# Patient Record
Sex: Female | Born: 1975 | Race: White | Hispanic: No | Marital: Single | Smoking: Never smoker
Health system: Southern US, Community
[De-identification: ages and names within clinical notes are randomized; demographics above are authoritative.]

## PROBLEM LIST (undated history)

## (undated) DIAGNOSIS — E785 Hyperlipidemia, unspecified: Secondary | ICD-10-CM

## (undated) DIAGNOSIS — K5090 Crohn's disease, unspecified, without complications: Secondary | ICD-10-CM

## (undated) DIAGNOSIS — E039 Hypothyroidism, unspecified: Secondary | ICD-10-CM

## (undated) DIAGNOSIS — M8580 Other specified disorders of bone density and structure, unspecified site: Secondary | ICD-10-CM

## (undated) DIAGNOSIS — K259 Gastric ulcer, unspecified as acute or chronic, without hemorrhage or perforation: Secondary | ICD-10-CM

## (undated) DIAGNOSIS — N809 Endometriosis, unspecified: Secondary | ICD-10-CM

## (undated) DIAGNOSIS — I82409 Acute embolism and thrombosis of unspecified deep veins of unspecified lower extremity: Secondary | ICD-10-CM

## (undated) DIAGNOSIS — R51 Headache: Secondary | ICD-10-CM

## (undated) DIAGNOSIS — M810 Age-related osteoporosis without current pathological fracture: Secondary | ICD-10-CM

## (undated) DIAGNOSIS — R519 Headache, unspecified: Secondary | ICD-10-CM

## (undated) HISTORY — PX: BREAST CYST EXCISION: SHX579

## (undated) HISTORY — DX: Endometriosis, unspecified: N80.9

## (undated) HISTORY — DX: Hypothyroidism, unspecified: E03.9

## (undated) HISTORY — PX: APPENDECTOMY: SHX54

## (undated) HISTORY — PX: PELVIC LAPAROSCOPY: SHX162

## (undated) HISTORY — PX: LAPAROSCOPY: SHX197

## (undated) HISTORY — PX: THYROIDECTOMY: SHX17

## (undated) HISTORY — PX: OOPHORECTOMY: SHX86

## (undated) HISTORY — DX: Other specified disorders of bone density and structure, unspecified site: M85.80

---

## 2004-04-05 ENCOUNTER — Ambulatory Visit: Payer: Self-pay | Admitting: Internal Medicine

## 2005-09-20 ENCOUNTER — Ambulatory Visit: Payer: Self-pay | Admitting: Family Medicine

## 2005-12-26 ENCOUNTER — Ambulatory Visit: Payer: Self-pay | Admitting: Gynecology

## 2006-01-09 ENCOUNTER — Ambulatory Visit: Payer: Self-pay | Admitting: Internal Medicine

## 2006-05-31 ENCOUNTER — Ambulatory Visit: Payer: Self-pay | Admitting: Gynecology

## 2006-06-25 ENCOUNTER — Ambulatory Visit: Payer: Self-pay | Admitting: Gynecology

## 2007-02-18 ENCOUNTER — Ambulatory Visit: Payer: Self-pay | Admitting: Family Medicine

## 2007-06-24 ENCOUNTER — Ambulatory Visit: Payer: Self-pay | Admitting: Family Medicine

## 2008-10-14 ENCOUNTER — Ambulatory Visit: Payer: Self-pay | Admitting: Internal Medicine

## 2009-03-04 ENCOUNTER — Ambulatory Visit: Payer: Self-pay | Admitting: Unknown Physician Specialty

## 2010-05-28 ENCOUNTER — Other Ambulatory Visit: Payer: Self-pay | Admitting: Family Medicine

## 2010-05-29 NOTE — Telephone Encounter (Signed)
Refill request

## 2010-05-31 NOTE — Telephone Encounter (Signed)
This pt. Is a [facility] pt. Of mine---Can we forward this info to them.

## 2010-08-08 NOTE — Assessment & Plan Note (Signed)
NAME:  Kelly Colon, Kelly Colon                  ACCOUNT NO.:  [id number]   MEDICAL RECORD NO.:  [id number]          PATIENT TYPE:  POB   LOCATION:  [facility] at [facility]         FACILITY:  [facility]   PHYSICIAN:  Tinnie Gens, MD        DATE OF BIRTH:  12/31/1975   DATE OF SERVICE:  06/24/2007                                  CLINIC NOTE   CHIEF COMPLAINT:  IUD insertion.   HISTORY OF PRESENT ILLNESS:  The patient is a 35 year old nullipara who  has significant endometriosis.  She is status post LSO.  She has been on  Depo Lupron for the better part of 9 years and was trying to come off of  that.  She has not undergone a DEXA scan. She does take calcium daily  basis.  She also has hypothyroidism and is on Synthroid 75 mcg daily for  quite some time.  The patient was instructed by Dr. Mia Creek to come off  for Depo Lupron and have an IUD inserted. Her insurance runs out today  and she would like to have it done today.  Her last Depo Lupron was in  December 2008. She has not had a cycle back since then.  She has been  tried on oral contraceptives continuously in the past and they have not  really worked for her.  A lengthy discussion was had about attempting to  have her on this medication in addition to IUD once her Depo Lupron  wears off.  The patient was counseled about the risk of this procedure  including risk of perforation of her uterus, given her postmenopausal  status and that she has never had a child before and difficulty in  trying to insert an IUD.   PHYSICAL EXAMINATION:  As noted in the chart, she is a well-developed,  well-nourished female in no acute distress.  GU:  She has very atrophic vaginal atrophy happening and her cervix is  very small and atrophic.   PROCEDURE:  Cervix is cleaned with Betadine x2 and is grasped with  single tooth tenaculum anteriorly. An off sounder  was used to try open  that as in the cervix and the uterus sounded approximately 6 cm, but was  not clear that  I was ever really past the cervix.  Attempt was made 3 or  4 times to place IUD, but it could not be inserted safely. The patient  also very tense during the exam and does report that she is not sexually  active.  After abandoning the procedure, the patient became quite  tearful, upset and asked to speak to [name].   IMPRESSION:  Endometriosis, long-term use of Depo Lupron, failed IUD  insertion today.   PLAN:  A very lengthy discussion was held with the patient regarding her  long-term use of Depo Lupron, its implications and what that means  exactly.  I am concerned about her bone health and did suggest a DEXA  scan, although she seems reluctant to get this done.  Also, discussed  further management now that she has failed IUD insertion. Continued OCs  are an option.  I do feel like she should  probably see REI for  discussion regarding her fertility.  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  The patient is not  in a relationship at the present time and was not sexually active, but  egg retrieval, hysterectomy, salpingo-oophorectomy were all discussed  with this patient as possible treatment options as well as IVF and what  else may have to be done for her.  She seemed very reluctant to hear  this and not sure that she will not have to go back on Depo Lupron for  her symptom management, but I do feel  like without any measurable data about her bone health that I would hold  off on reviewing this at present time.  She will go on Fem Con FE for  now continuously and will refer her for a DEXA scan and REI.           ______________________________  Tinnie Gens, MD     TP/MEDQ  D:  06/24/2007  T:  06/24/2007  Job:  [id number]

## 2010-10-03 ENCOUNTER — Other Ambulatory Visit: Payer: Self-pay | Admitting: *Deleted

## 2010-10-11 ENCOUNTER — Telehealth: Payer: Self-pay | Admitting: *Deleted

## 2010-10-11 DIAGNOSIS — M8580 Other specified disorders of bone density and structure, unspecified site: Secondary | ICD-10-CM

## 2010-10-11 MED ORDER — ALENDRONATE SODIUM 70 MG PO TABS: 70.0000 mg | ORAL_TABLET | ORAL | Status: DC

## 2010-10-11 NOTE — Telephone Encounter (Signed)
Medication was faxed to appropriate pharmacy [phone] per patient request.

## 2010-10-12 ENCOUNTER — Other Ambulatory Visit: Payer: Self-pay | Admitting: *Deleted

## 2011-10-05 DIAGNOSIS — N809 Endometriosis, unspecified: Secondary | ICD-10-CM | POA: Insufficient documentation

## 2011-12-07 ENCOUNTER — Ambulatory Visit: Payer: Self-pay | Admitting: Unknown Physician Specialty

## 2011-12-12 ENCOUNTER — Other Ambulatory Visit: Payer: Self-pay

## 2011-12-12 LAB — PATHOLOGY REPORT

## 2011-12-13 LAB — SEDIMENTATION RATE: Erythrocyte Sed Rate: 39 mm/hr — ABNORMAL HIGH (ref 0–20)

## 2012-02-04 ENCOUNTER — Emergency Department: Payer: Self-pay | Admitting: Emergency Medicine

## 2012-02-05 LAB — CBC WITH DIFFERENTIAL/PLATELET
Basophil #: 0 10*3/uL (ref 0.0–0.1)
Basophil %: 0.4 %
HCT: 37.6 % (ref 35.0–47.0)
HGB: 12.6 g/dL (ref 12.0–16.0)
Lymphocyte %: 19 %
Monocyte %: 5.9 %
Neutrophil #: 7.6 10*3/uL — ABNORMAL HIGH (ref 1.4–6.5)
Platelet: 306 10*3/uL (ref 150–440)
RDW: 14.8 % — ABNORMAL HIGH (ref 11.5–14.5)
WBC: 10.6 10*3/uL (ref 3.6–11.0)

## 2012-02-05 LAB — COMPREHENSIVE METABOLIC PANEL
Albumin: 3.3 g/dL — ABNORMAL LOW (ref 3.4–5.0)
Alkaline Phosphatase: 61 U/L (ref 50–136)
Anion Gap: 11 (ref 7–16)
BUN: 8 mg/dL (ref 7–18)
Chloride: 102 mmol/L (ref 98–107)
Creatinine: 0.64 mg/dL (ref 0.60–1.30)
EGFR (African American): >60
Glucose: 96 mg/dL (ref 65–99)
Potassium: 3.9 mmol/L (ref 3.5–5.1)
SGOT(AST): 12 U/L — ABNORMAL LOW (ref 15–37)
Total Protein: 7.4 g/dL (ref 6.4–8.2)

## 2012-02-05 LAB — URINALYSIS, COMPLETE
Bilirubin,UR: NEGATIVE
Ketone: NEGATIVE
Ph: 6 (ref 4.5–8.0)
Protein: NEGATIVE
RBC,UR: 2599 /HPF (ref 0–5)
Specific Gravity: 1.017 (ref 1.003–1.030)
Squamous Epithelial: 1
WBC UR: NONE SEEN /HPF (ref 0–5)

## 2012-04-01 ENCOUNTER — Ambulatory Visit: Payer: Self-pay | Admitting: Internal Medicine

## 2013-03-13 ENCOUNTER — Ambulatory Visit (INDEPENDENT_AMBULATORY_CARE_PROVIDER_SITE_OTHER): Payer: BC Managed Care – PPO | Admitting: Family Medicine

## 2013-03-13 ENCOUNTER — Encounter: Payer: Self-pay | Admitting: Family Medicine

## 2013-03-13 VITALS — BP 134/83 | HR 82 | Ht 62.0 in | Wt 117.0 lb

## 2013-03-13 DIAGNOSIS — Z113 Encounter for screening for infections with a predominantly sexual mode of transmission: Secondary | ICD-10-CM

## 2013-03-13 DIAGNOSIS — N809 Endometriosis, unspecified: Secondary | ICD-10-CM

## 2013-03-13 DIAGNOSIS — E039 Hypothyroidism, unspecified: Secondary | ICD-10-CM | POA: Insufficient documentation

## 2013-03-13 DIAGNOSIS — M899 Disorder of bone, unspecified: Secondary | ICD-10-CM

## 2013-03-13 DIAGNOSIS — M8580 Other specified disorders of bone density and structure, unspecified site: Secondary | ICD-10-CM | POA: Insufficient documentation

## 2013-03-13 NOTE — Assessment & Plan Note (Signed)
Being managed by primary care

## 2013-03-13 NOTE — Assessment & Plan Note (Addendum)
Currently being managed by [facility]--considering hysterectomy. Discussed at length, fertility, her age, adoption and other options for children.

## 2013-03-13 NOTE — Assessment & Plan Note (Addendum)
Check Dexa scan. Continue Ca + vit D

## 2013-03-13 NOTE — Progress Notes (Signed)
Subjective:    Patient ID: Kelly Colon, female    DOB: 06/05/1975, 37 y.o.   MRN: [id number]  HPI  Pt. Returns for the first time since  2009.  She has a long h/o endometrisosis, including laparoscopy x 4 with LSO and Yag laser ablation + 9 years of Depo Lupron and subsequent osteopenia.  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  She has primarily been managed by [facility].  She is here today and concerned that she might have an HPV outbreak, with several small bumps noted on her external genitalia. She has one partner, but apparently he has not been monogamous. She has not been taking her fosamax as prescribed, but does report taking her Ca and Vitamin D.  No scans for many years.  Past Medical History  Diagnosis Date  . Osteopenia   . Endometriosis   . Hypothyroidism    Past Surgical History  Procedure Laterality Date  . Oophorectomy Left   . Laparoscopy      x 4, for treatment of endometriosis   No Known Allergies    Medication List       This list is accurate as of: 03/13/13  1:11 PM.  Always use your most recent med list.               calcium-vitamin D 250-100 MG-UNIT per tablet  Take 2 tablets by mouth 2 (two) times daily.     levothyroxine 75 MCG tablet  Commonly known as:  SYNTHROID, LEVOTHROID  Take 75 mcg by mouth daily before breakfast.       History   Social History  . Marital Status: Single    Spouse Name: N/A    Number of Children: N/A  . Years of Education: N/A   Social History Main Topics  . Smoking status: Never Smoker   . Smokeless tobacco: Never Used  . Alcohol Use: No  . Drug Use: No  . Sexual Activity: No   Other Topics Concern  . None   Social History Narrative  . None   Family History  Problem Relation Age of Onset  . Endometriosis Mother   . Stroke Mother   . Heart attack Father   . Heart attack Paternal Grandmother   . Cancer Paternal Grandfather     throat, stomach     Review of Systems    Constitutional: Negative for fever and chills.  HENT: Negative for rhinorrhea and sore throat.   Eyes: Negative for visual disturbance.  Respiratory: Negative for shortness of breath and wheezing.   Cardiovascular: Negative for chest pain and leg swelling.  Gastrointestinal: Negative for nausea, vomiting, abdominal pain, diarrhea and constipation.  Genitourinary: Negative for dysuria, vaginal bleeding, vaginal discharge, vaginal pain and pelvic pain.  Musculoskeletal: Negative for back pain.  Skin: Negative for pallor and rash.  Allergic/Immunologic: Negative for environmental allergies.  Neurological: Negative for tremors, weakness and headaches.  Hematological: Does not bruise/bleed easily.  Psychiatric/Behavioral: Negative for dysphoric mood and decreased concentration.       Objective:   Physical Exam  Vitals reviewed. Constitutional: She appears well-developed and well-nourished. No distress.  HENT:  Head: Normocephalic and atraumatic.  Eyes: No scleral icterus.  Neck: Neck supple.  Cardiovascular: Normal rate and regular rhythm.   No murmur heard. Pulmonary/Chest: Effort normal.  Abdominal: Bowel sounds are normal. There is no tenderness.  Genitourinary: Vagina normal. There is lesion (several small fleshy skin tags noted, in a linear fashion. <  1 mm) on the right labia. There is no rash or tenderness on the right labia. There is no rash or tenderness on the left labia.          Assessment & Plan:  No evidence of genital warts.  Suspect lesions are skin tage. Full STD screen given previous HPV positive.

## 2013-03-14 LAB — HEPATITIS B SURFACE ANTIGEN: Hepatitis B Surface Ag: NEGATIVE

## 2013-03-14 LAB — RPR

## 2013-03-14 LAB — HIV ANTIBODY (ROUTINE TESTING W REFLEX): HIV: NONREACTIVE

## 2013-03-16 LAB — GC/CHLAMYDIA PROBE AMP: GC Probe RNA: NEGATIVE

## 2013-06-12 ENCOUNTER — Encounter: Payer: Self-pay | Admitting: Family Medicine

## 2013-06-12 ENCOUNTER — Ambulatory Visit (INDEPENDENT_AMBULATORY_CARE_PROVIDER_SITE_OTHER): Payer: BC Managed Care – PPO | Admitting: Family Medicine

## 2013-06-12 VITALS — BP 124/78 | HR 70 | Ht 62.0 in | Wt 121.0 lb

## 2013-06-12 DIAGNOSIS — B081 Molluscum contagiosum: Secondary | ICD-10-CM

## 2013-06-12 MED ORDER — IMIQUIMOD 5 % EX CREA: TOPICAL_CREAM | CUTANEOUS | Status: DC

## 2013-06-12 NOTE — Assessment & Plan Note (Signed)
Appears most consistent with this, however, cannot r/o genital wart outbreak.  Will treat with Aldara.  Advised of way to use and probable length of treatment.  If not better, consider in-office treatment or referral to derm.

## 2013-06-12 NOTE — Progress Notes (Signed)
    Subjective:    Patient ID: Kelly Colon is a 38 y.o. female presenting with Genital Warts  on 06/12/2013  HPI: Still with these lesions on the outside that she would like checked.  They might be bigger as she messes with them. She is for pap and eval at [facility] for endometriosis.  Thinks some may be coming back. Has normal STD testing last visit. Had abnl pap with HPV.  Review of Systems  Constitutional: Negative for fever and chills.  Respiratory: Negative for shortness of breath.   Cardiovascular: Negative for chest pain.  Gastrointestinal: Negative for nausea, vomiting and abdominal pain.  Genitourinary: Negative for dysuria.  Skin: Negative for rash.      Objective:    BP 124/78  Pulse 70  Ht 5\' 2"  (1.575 m)  Wt 121 lb (54.885 kg)  BMI 22.13 kg/m2  LMP 05/29/2013 Physical Exam  Constitutional: She is oriented to person, place, and time. She appears well-developed and well-nourished. No distress.  HENT:  Head: Normocephalic and atraumatic.  Eyes: No scleral icterus.  Neck: Neck supple.  Cardiovascular: Normal rate.   Pulmonary/Chest: Effort normal.  Abdominal: Soft.  Neurological: She is alert and oriented to person, place, and time.  Skin: Skin is warm and dry.  Psychiatric: She has a normal mood and affect.   GU:-multiple small flat stuck on appearing papules consistent with molluscum.  One area of verrucous like at top of labia minor on right.     Assessment & Plan:  Mollusca contagiosa Appears most consistent with this, however, cannot r/o genital wart outbreak.  Will treat with Aldara.  Advised of way to use and probable length of treatment.  If not better, consider in-office treatment or referral to derm.     Return in about 3 months (around 09/12/2013) for a follow-up.

## 2013-06-16 ENCOUNTER — Telehealth: Payer: Self-pay | Admitting: *Deleted

## 2013-06-16 DIAGNOSIS — B081 Molluscum contagiosum: Secondary | ICD-10-CM

## 2013-06-16 MED ORDER — IMIQUIMOD 5 % EX CREA: TOPICAL_CREAM | CUTANEOUS | Status: DC

## 2013-06-16 NOTE — Telephone Encounter (Signed)
Patients medication for Aldara was sent to her mail order pharmacy and she would like it called to [facility] in [place] so she can go ahead and get it started.

## 2013-07-15 ENCOUNTER — Other Ambulatory Visit: Payer: Self-pay | Admitting: Family Medicine

## 2013-12-15 ENCOUNTER — Institutional Professional Consult (permissible substitution): Payer: BC Managed Care – PPO | Admitting: Nurse Practitioner

## 2013-12-22 ENCOUNTER — Encounter: Payer: Self-pay | Admitting: Nurse Practitioner

## 2013-12-22 ENCOUNTER — Ambulatory Visit (INDEPENDENT_AMBULATORY_CARE_PROVIDER_SITE_OTHER): Payer: BC Managed Care – PPO | Admitting: Nurse Practitioner

## 2013-12-22 VITALS — BP 114/74 | HR 54 | Wt 125.0 lb

## 2013-12-22 DIAGNOSIS — F411 Generalized anxiety disorder: Secondary | ICD-10-CM | POA: Diagnosis not present

## 2013-12-22 DIAGNOSIS — G43009 Migraine without aura, not intractable, without status migrainosus: Secondary | ICD-10-CM | POA: Diagnosis not present

## 2013-12-22 DIAGNOSIS — G4700 Insomnia, unspecified: Secondary | ICD-10-CM

## 2013-12-22 DIAGNOSIS — F419 Anxiety disorder, unspecified: Secondary | ICD-10-CM

## 2013-12-22 NOTE — Patient Instructions (Signed)

## 2013-12-22 NOTE — Progress Notes (Signed)
Diagnosis: Migraine without Aura, Anxiety, Insomnia, muscle spasm  History: Kelly Colon 38 y.o. G0P0000 presents to [facility] office for migraine consultation. She has had migraine since her teen years. Her mother and brother have them as well. She does not have aura. She had an MRI at 14 and none since. Her migraines have been daily in past but are better now. She takes Lexapro off and on for anxiety. She broke up with her boyfriend one month ago and things are getting better. . She is schooled in biofeedback from [employer]. Her sleep is poor and she takes up to 4 Melatonin per night, Atarax and benadryl to go to sleep. Without this medication it might take 3 plus hours to get to sleep. She may get up twice at night to use toilet but goes back to sleep easily. She was given Imitrex once but the cost was more than she could afford.   Location: Right temple and Occipital  Number of Headache days/month: 15/30 Severe: 4 Moderate: 8 Mild:4  Current Outpatient Prescriptions on File Prior to Visit  Medication Sig Dispense Refill  . calcium-vitamin D 250-100 MG-UNIT per tablet Take 2 tablets by mouth 2 (two) times daily.      . imiquimod (ALDARA) 5 % cream APPLY TOPICALLY 3 (THREE) TIMES A WEEK.  24 each  0  . levothyroxine (SYNTHROID, LEVOTHROID) 75 MCG tablet Take 75 mcg by mouth daily before breakfast.       No current facility-administered medications on file prior to visit.    Acute/ prevention: Excedrin, Fiorinal, Atarax, Nortriptyline, Skelaxin  Past Medical History  Diagnosis Date  . Osteopenia   . Endometriosis   . Hypothyroidism    Past Surgical History  Procedure Laterality Date  . Oophorectomy Left   . Laparoscopy      x 4, for treatment of endometriosis   Family History  Problem Relation Age of Onset  . Endometriosis Mother   . Stroke Mother   . Heart attack Father   . Heart attack Paternal Grandmother   . Cancer Paternal Grandfather     throat, stomach   Social  History:  reports that she has never smoked. She has never used smokeless tobacco. She reports that she does not drink alcohol or use illicit drugs. She is Sales executive, not married, no children. Recent break up with boyfriend Allergies: No Known Allergies  Triggers: stress, hormones  Birth control: none  ROS: positive for endometriosis, colitis, migraine, anxiety, insomnia, muscle spasm, negative for depression, HTN, cardiac issues  Exam: Well developed, well nourished caucasian female   General: NAD HEENT: Negative Cardiac:RRR Lungs:Clear Neuro:Negative Skin:Warm and dry  Impression:migraine - common Anxiety Insomnia Muscle Spasm/ neck  Plan : Discussed the pathophysiology of migraine and medication management. She declines prevention at this time.  She is advised to stay on her Lexapro daily for anxiety. For sleep will stop Melatonin, atarax, and benadryl and take Ambien. For migraine will take Maxalt. For muscle spasm we discussed physical therapy and using heat and skelaxin. She will follow up in 6 weeks or sooner if needed.    Time Spent: 45 minutes

## 2013-12-31 ENCOUNTER — Telehealth: Payer: Self-pay | Admitting: *Deleted

## 2013-12-31 MED ORDER — RIZATRIPTAN BENZOATE 10 MG PO TABS: 10.0000 mg | ORAL_TABLET | ORAL | Status: DC | PRN

## 2013-12-31 NOTE — Telephone Encounter (Signed)
Patient is requesting a refill of Maxalt.  She would also like to let us know that she is increasing the Ambien to a whole tablet at night as opposed to a half because with 1/2 she is still waking up.

## 2014-01-08 ENCOUNTER — Ambulatory Visit: Payer: Self-pay | Admitting: Otolaryngology

## 2014-01-08 ENCOUNTER — Other Ambulatory Visit: Payer: Self-pay

## 2014-01-19 ENCOUNTER — Telehealth: Payer: Self-pay | Admitting: *Deleted

## 2014-01-19 NOTE — Telephone Encounter (Signed)
Patient is taking a whole Ambien at night to sleep and it does help to get to sleep but she is waking up in the middle of the night unable to go back to sleep.  I spoke with Jannifer Rodney FNP and she is suggests that she should add back her atarax in addition to the Ambien until her follow up appointment at which time we will reassess what the best plan is.  I have left a message to this effect on the patients cell phone per her request as it is hard to get ahold of her while she is at work.

## 2014-01-22 ENCOUNTER — Ambulatory Visit: Payer: Self-pay | Admitting: Otolaryngology

## 2014-02-02 ENCOUNTER — Encounter: Payer: Self-pay | Admitting: Nurse Practitioner

## 2014-02-02 ENCOUNTER — Ambulatory Visit (INDEPENDENT_AMBULATORY_CARE_PROVIDER_SITE_OTHER): Payer: BC Managed Care – PPO | Admitting: Nurse Practitioner

## 2014-02-02 ENCOUNTER — Ambulatory Visit: Payer: BC Managed Care – PPO | Admitting: Nurse Practitioner

## 2014-02-02 DIAGNOSIS — G4700 Insomnia, unspecified: Secondary | ICD-10-CM | POA: Diagnosis not present

## 2014-02-02 DIAGNOSIS — F419 Anxiety disorder, unspecified: Secondary | ICD-10-CM | POA: Diagnosis not present

## 2014-02-02 DIAGNOSIS — G43009 Migraine without aura, not intractable, without status migrainosus: Secondary | ICD-10-CM | POA: Diagnosis not present

## 2014-02-02 MED ORDER — ZOLPIDEM TARTRATE ER 12.5 MG PO TBCR: 12.5000 mg | EXTENDED_RELEASE_TABLET | Freq: Every evening | ORAL | Status: AC | PRN

## 2014-02-02 NOTE — Progress Notes (Signed)
History:  Kelly Colon is a 38 y.o. G0P0000 who presents to clinic today for follow up with migraines. She is doing well with maxalt. The Kelly Colon is working to put her to sleep but not keep her asleep longer than 4 am. She added back Atarax. She has only been taking Lexapro 5 mg and is quite anxious about possible upcoming hysterectomy and job change.   The following portions of the patient's history were reviewed and updated as appropriate: allergies, current medications, past family history, past medical history, past social history, past surgical history and problem list.  Review of Systems:    Objective:  Physical Exam There were no vitals taken for this visit. GENERAL: Well-developed, well-nourished female in no acute distress.  HEENT: Normocephalic, atraumatic.  NECK: Supple. Normal thyroid.  LUNGS: Normal rate. Clear to auscultation bilaterally.  HEART: Regular rate and rhythm with no adventitious sounds. EXTREMITIES: No cyanosis, clubbing, or edema, 2+ distal pulses.   Labs and Imaging No results found.  Assessment & Plan:  Assessment: Migraines Insomnia  Plans: Ambien 12.5 mg po qday Lexapro 10 mg po qday Consider sleep study Follow up 6 months  Kelly PhenixLinda M Caylyn Tedeschi, NP 02/02/2014 4:35 PM

## 2014-02-02 NOTE — Patient Instructions (Signed)

## 2014-03-11 ENCOUNTER — Ambulatory Visit: Payer: Self-pay | Admitting: Otolaryngology

## 2014-03-11 LAB — MAGNESIUM: MAGNESIUM: 1.8 mg/dL

## 2014-03-11 LAB — CALCIUM
CALCIUM: 8 mg/dL — AB (ref 8.5–10.1)
Calcium, Total: 7.5 mg/dL — ABNORMAL LOW (ref 8.5–10.1)

## 2014-03-11 LAB — ALBUMIN: Albumin: 2.9 g/dL — ABNORMAL LOW (ref 3.4–5.0)

## 2014-03-12 LAB — CALCIUM: Calcium, Total: 8.2 mg/dL — ABNORMAL LOW (ref 8.5–10.1)

## 2014-03-13 ENCOUNTER — Emergency Department: Payer: Self-pay | Admitting: Emergency Medicine

## 2014-03-13 LAB — COMPREHENSIVE METABOLIC PANEL
Albumin: 2.9 g/dL — ABNORMAL LOW (ref 3.4–5.0)
Alkaline Phosphatase: 45 U/L — ABNORMAL LOW
Anion Gap: 6 — ABNORMAL LOW (ref 7–16)
BUN: 12 mg/dL (ref 7–18)
Bilirubin,Total: 0.2 mg/dL (ref 0.2–1.0)
Calcium, Total: 8.2 mg/dL — ABNORMAL LOW (ref 8.5–10.1)
Chloride: 102 mmol/L (ref 98–107)
Co2: 30 mmol/L (ref 21–32)
Creatinine: 0.71 mg/dL (ref 0.60–1.30)
EGFR (African American): >60
EGFR (Non-African Amer.): >60
Glucose: 100 mg/dL — ABNORMAL HIGH (ref 65–99)
Osmolality: 276 (ref 275–301)
Potassium: 4.2 mmol/L (ref 3.5–5.1)
SGOT(AST): 17 U/L (ref 15–37)
SGPT (ALT): 14 U/L
Sodium: 138 mmol/L (ref 136–145)
Total Protein: 6.4 g/dL (ref 6.4–8.2)

## 2014-03-13 LAB — CBC WITH DIFFERENTIAL/PLATELET
Basophil #: 0.1 10*3/uL (ref 0.0–0.1)
Basophil %: 0.9 %
Eosinophil #: 0.1 10*3/uL (ref 0.0–0.7)
Eosinophil %: 0.4 %
HCT: 38.2 % (ref 35.0–47.0)
HGB: 12.2 g/dL (ref 12.0–16.0)
Lymphocyte #: 1.4 10*3/uL (ref 1.0–3.6)
Lymphocyte %: 9.4 %
MCH: 27.3 pg (ref 26.0–34.0)
MCHC: 32 g/dL (ref 32.0–36.0)
MCV: 85 fL (ref 80–100)
Monocyte #: 0.9 x10 3/mm (ref 0.2–0.9)
Monocyte %: 6 %
Neutrophil #: 12.6 10*3/uL — ABNORMAL HIGH (ref 1.4–6.5)
Neutrophil %: 83.3 %
Platelet: 300 10*3/uL (ref 150–440)
RBC: 4.48 10*6/uL (ref 3.80–5.20)
RDW: 14.4 % (ref 11.5–14.5)
WBC: 15.1 10*3/uL — ABNORMAL HIGH (ref 3.6–11.0)

## 2014-03-23 ENCOUNTER — Ambulatory Visit: Payer: Self-pay | Admitting: Hematology and Oncology

## 2014-03-25 LAB — IRON AND TIBC
Iron Bind.Cap.(Total): 403 ug/dL (ref 250–450)
Iron Saturation: 52 %
Iron: 211 ug/dL — ABNORMAL HIGH (ref 50–170)
UNBOUND IRON-BIND. CAP.: 192 ug/dL

## 2014-03-25 LAB — CBC CANCER CENTER
COMMENT - H1-COM2: NORMAL
Comment - H1-Com3: NORMAL
EOS PCT: 1 %
HCT: 30.2 % — AB (ref 35.0–47.0)
HGB: 9.8 g/dL — ABNORMAL LOW (ref 12.0–16.0)
LYMPHS PCT: 25 %
MCH: 27.2 pg (ref 26.0–34.0)
MCHC: 32.5 g/dL (ref 32.0–36.0)
MCV: 84 fL (ref 80–100)
Monocytes: 6 %
Platelet: 329 x10 3/mm (ref 150–440)
RBC: 3.6 10*6/uL — ABNORMAL LOW (ref 3.80–5.20)
RDW: 14 % (ref 11.5–14.5)
Segmented Neutrophils: 65 %
Variant Lymphocyte: 3 %
WBC: 9.2 x10 3/mm (ref 3.6–11.0)

## 2014-03-25 LAB — SEDIMENTATION RATE: Erythrocyte Sed Rate: 28 mm/hr — ABNORMAL HIGH (ref 0–20)

## 2014-03-25 LAB — RETICULOCYTES
Absolute Retic Count: 0.0628 10*6/uL (ref 0.019–0.186)
Reticulocyte: 1.78 % (ref 0.4–3.1)

## 2014-03-25 LAB — COMPREHENSIVE METABOLIC PANEL
ALBUMIN: 3.3 g/dL — AB (ref 3.4–5.0)
ALK PHOS: 58 U/L
ALT: 20 U/L
ANION GAP: 3 — AB (ref 7–16)
BUN: 12 mg/dL (ref 7–18)
Bilirubin,Total: 0.2 mg/dL (ref 0.2–1.0)
CALCIUM: 8.4 mg/dL — AB (ref 8.5–10.1)
CO2: 30 mmol/L (ref 21–32)
CREATININE: 0.64 mg/dL (ref 0.60–1.30)
Chloride: 108 mmol/L — ABNORMAL HIGH (ref 98–107)
EGFR (Non-African Amer.): >60
GLUCOSE: 85 mg/dL (ref 65–99)
Osmolality: 280 (ref 275–301)
Potassium: 3.9 mmol/L (ref 3.5–5.1)
SGOT(AST): 22 U/L (ref 15–37)
Sodium: 141 mmol/L (ref 136–145)
Total Protein: 6.7 g/dL (ref 6.4–8.2)

## 2014-03-25 LAB — APTT: Activated PTT: 31.1 secs (ref 23.6–35.9)

## 2014-03-25 LAB — PROTIME-INR
INR: 1.2
Prothrombin Time: 15.2 secs — ABNORMAL HIGH (ref 11.5–14.7)

## 2014-03-25 LAB — FOLATE: Folic Acid: 31 ng/mL — ABNORMAL HIGH (ref 3.1–17.5)

## 2014-03-25 LAB — FERRITIN: Ferritin (ARMC): 8 ng/mL (ref 8–388)

## 2014-03-25 LAB — LACTATE DEHYDROGENASE: LDH: 130 U/L (ref 81–246)

## 2014-03-26 ENCOUNTER — Ambulatory Visit: Payer: Self-pay | Admitting: Hematology and Oncology

## 2014-03-26 LAB — CBC WITH DIFFERENTIAL/PLATELET
Basophil #: 0.1 10*3/uL (ref 0.0–0.1)
Basophil %: 0.4 %
Eosinophil #: 0.2 10*3/uL (ref 0.0–0.7)
Eosinophil %: 1.2 %
HCT: 25.1 % — ABNORMAL LOW (ref 35.0–47.0)
HGB: 8 g/dL — ABNORMAL LOW (ref 12.0–16.0)
Lymphocyte #: 2.5 10*3/uL (ref 1.0–3.6)
Lymphocyte %: 19.3 %
MCH: 27.6 pg (ref 26.0–34.0)
MCHC: 32 g/dL (ref 32.0–36.0)
MCV: 86 fL (ref 80–100)
MONO ABS: 0.8 x10 3/mm (ref 0.2–0.9)
Monocyte %: 5.8 %
Neutrophil #: 9.6 10*3/uL — ABNORMAL HIGH (ref 1.4–6.5)
Neutrophil %: 73.3 %
PLATELETS: 327 10*3/uL (ref 150–440)
RBC: 2.92 10*6/uL — ABNORMAL LOW (ref 3.80–5.20)
RDW: 14.2 % (ref 11.5–14.5)
WBC: 13.1 10*3/uL — ABNORMAL HIGH (ref 3.6–11.0)

## 2014-03-26 LAB — BASIC METABOLIC PANEL
Anion Gap: 0 — ABNORMAL LOW (ref 7–16)
BUN: 9 mg/dL (ref 7–18)
CALCIUM: 8.3 mg/dL — AB (ref 8.5–10.1)
Chloride: 109 mmol/L — ABNORMAL HIGH (ref 98–107)
Co2: 32 mmol/L (ref 21–32)
Creatinine: 0.71 mg/dL (ref 0.60–1.30)
EGFR (African American): >60
EGFR (Non-African Amer.): >60
Glucose: 96 mg/dL (ref 65–99)
OSMOLALITY: 280 (ref 275–301)
POTASSIUM: 3.9 mmol/L (ref 3.5–5.1)
Sodium: 141 mmol/L (ref 136–145)

## 2014-03-27 ENCOUNTER — Ambulatory Visit: Payer: Self-pay

## 2014-03-27 LAB — CBC WITH DIFFERENTIAL/PLATELET
BASOS ABS: 0.1 10*3/uL (ref 0.0–0.1)
Basophil #: 0 10*3/uL (ref 0.0–0.1)
Basophil %: 0.4 %
Basophil %: 0.5 %
EOS ABS: 0.2 10*3/uL (ref 0.0–0.7)
Eosinophil #: 0.2 10*3/uL (ref 0.0–0.7)
Eosinophil %: 1.4 %
Eosinophil %: 1.8 %
HCT: 22.8 % — AB (ref 35.0–47.0)
HCT: 23.4 % — AB (ref 35.0–47.0)
HGB: 7.3 g/dL — ABNORMAL LOW (ref 12.0–16.0)
HGB: 7.8 g/dL — ABNORMAL LOW (ref 12.0–16.0)
Lymphocyte #: 2.7 10*3/uL (ref 1.0–3.6)
Lymphocyte #: 3.2 10*3/uL (ref 1.0–3.6)
Lymphocyte %: 26.2 %
Lymphocyte %: 28.8 %
MCH: 27.9 pg (ref 26.0–34.0)
MCH: 29.1 pg (ref 26.0–34.0)
MCHC: 33.3 g/dL (ref 32.0–36.0)
MCHC: 32.2 g/dL (ref 32.0–36.0)
MCV: 87 fL (ref 80–100)
MCV: 88 fL (ref 80–100)
MONOS PCT: 7.6 %
Monocyte #: 0.7 x10 3/mm (ref 0.2–0.9)
Monocyte #: 0.7 x10 3/mm (ref 0.2–0.9)
Monocyte %: 5.6 %
NEUTROS ABS: 5.9 10*3/uL (ref 1.4–6.5)
NEUTROS ABS: 8.2 10*3/uL — AB (ref 1.4–6.5)
Neutrophil %: 61.4 %
Neutrophil %: 66.3 %
Platelet: 214 10*3/uL (ref 150–440)
Platelet: 266 10*3/uL (ref 150–440)
RBC: 2.63 10*6/uL — ABNORMAL LOW (ref 3.80–5.20)
RBC: 2.67 10*6/uL — ABNORMAL LOW (ref 3.80–5.20)
RDW: 13.9 % (ref 11.5–14.5)
RDW: 14.4 % (ref 11.5–14.5)
WBC: 12.3 10*3/uL — ABNORMAL HIGH (ref 3.6–11.0)
WBC: 9.6 10*3/uL (ref 3.6–11.0)

## 2014-03-27 LAB — HEMOGLOBIN
HGB: 8.5 g/dL — ABNORMAL LOW (ref 12.0–16.0)
HGB: 8.3 g/dL — ABNORMAL LOW (ref 12.0–16.0)

## 2014-03-27 LAB — HEPARIN LEVEL (UNFRACTIONATED): Anti-Xa(Unfractionated): 0.3 IU/mL (ref 0.30–0.70)

## 2014-03-27 LAB — TSH: Thyroid Stimulating Horm: 1.7 u[IU]/mL

## 2014-03-27 LAB — PROTIME-INR
INR: 1.1
Prothrombin Time: 14 secs (ref 11.5–14.7)

## 2014-03-27 LAB — APTT: ACTIVATED PTT: 25 s (ref 23.6–35.9)

## 2014-03-28 ENCOUNTER — Inpatient Hospital Stay: Payer: Self-pay | Admitting: Internal Medicine

## 2014-03-28 LAB — CBC WITH DIFFERENTIAL/PLATELET
BASOS PCT: 0.6 %
Basophil #: 0.1 10*3/uL (ref 0.0–0.1)
EOS PCT: 2.9 %
Eosinophil #: 0.3 10*3/uL (ref 0.0–0.7)
HCT: 23.2 % — ABNORMAL LOW (ref 35.0–47.0)
LYMPHS ABS: 3.4 10*3/uL (ref 1.0–3.6)
Lymphocyte %: 36.2 %
MCH: 29 pg (ref 26.0–34.0)
MCHC: 33 g/dL (ref 32.0–36.0)
MCV: 88 fL (ref 80–100)
MONO ABS: 0.6 x10 3/mm (ref 0.2–0.9)
Monocyte %: 6.5 %
Neutrophil #: 5.1 10*3/uL (ref 1.4–6.5)
Neutrophil %: 53.8 %
Platelet: 228 10*3/uL (ref 150–440)
RBC: 2.65 10*6/uL — AB (ref 3.80–5.20)
RDW: 14.6 % — ABNORMAL HIGH (ref 11.5–14.5)
WBC: 9.4 10*3/uL (ref 3.6–11.0)

## 2014-03-28 LAB — HEPARIN LEVEL (UNFRACTIONATED): Anti-Xa(Unfractionated): 0.39 IU/mL (ref 0.30–0.70)

## 2014-03-28 LAB — HEMOGLOBIN: HGB: 7.7 g/dL — ABNORMAL LOW (ref 12.0–16.0)

## 2014-03-29 LAB — CBC WITH DIFFERENTIAL/PLATELET
Basophil #: 0 10*3/uL (ref 0.0–0.1)
Basophil %: 0.4 %
EOS ABS: 0.3 10*3/uL (ref 0.0–0.7)
EOS PCT: 3.2 %
HCT: 23.1 % — ABNORMAL LOW (ref 35.0–47.0)
HGB: 7.7 g/dL — ABNORMAL LOW (ref 12.0–16.0)
LYMPHS ABS: 3.6 10*3/uL (ref 1.0–3.6)
Lymphocyte %: 35 %
MCH: 29.5 pg (ref 26.0–34.0)
MCHC: 33.2 g/dL (ref 32.0–36.0)
MCV: 89 fL (ref 80–100)
MONO ABS: 0.7 x10 3/mm (ref 0.2–0.9)
Monocyte %: 6.7 %
NEUTROS PCT: 54.7 %
Neutrophil #: 5.6 10*3/uL (ref 1.4–6.5)
PLATELETS: 246 10*3/uL (ref 150–440)
RBC: 2.6 10*6/uL — AB (ref 3.80–5.20)
RDW: 15.1 % — ABNORMAL HIGH (ref 11.5–14.5)
WBC: 10.3 10*3/uL (ref 3.6–11.0)

## 2014-03-29 LAB — URINE IEP, RANDOM

## 2014-03-29 LAB — HEPARIN LEVEL (UNFRACTIONATED): Anti-Xa(Unfractionated): 0.66 IU/mL (ref 0.30–0.70)

## 2014-03-30 LAB — BASIC METABOLIC PANEL
ANION GAP: 5 — AB (ref 7–16)
BUN: 11 mg/dL (ref 7–18)
CREATININE: 0.81 mg/dL (ref 0.60–1.30)
Calcium, Total: 8.6 mg/dL (ref 8.5–10.1)
Chloride: 109 mmol/L — ABNORMAL HIGH (ref 98–107)
Co2: 27 mmol/L (ref 21–32)
EGFR (Non-African Amer.): >60
Glucose: 92 mg/dL (ref 65–99)
Osmolality: 280 (ref 275–301)
Potassium: 3.8 mmol/L (ref 3.5–5.1)
Sodium: 141 mmol/L (ref 136–145)

## 2014-03-30 LAB — CBC WITH DIFFERENTIAL/PLATELET
Basophil #: 0 10*3/uL (ref 0.0–0.1)
Basophil %: 0.5 %
EOS ABS: 0.4 10*3/uL (ref 0.0–0.7)
Eosinophil %: 3.7 %
HCT: 26.8 % — ABNORMAL LOW (ref 35.0–47.0)
HGB: 8.9 g/dL — ABNORMAL LOW (ref 12.0–16.0)
LYMPHS PCT: 25.3 %
Lymphocyte #: 2.6 10*3/uL (ref 1.0–3.6)
MCH: 29.6 pg (ref 26.0–34.0)
MCHC: 33.3 g/dL (ref 32.0–36.0)
MCV: 89 fL (ref 80–100)
Monocyte #: 0.8 x10 3/mm (ref 0.2–0.9)
Monocyte %: 7.9 %
NEUTROS ABS: 6.4 10*3/uL (ref 1.4–6.5)
Neutrophil %: 62.6 %
PLATELETS: 296 10*3/uL (ref 150–440)
RBC: 3.01 10*6/uL — ABNORMAL LOW (ref 3.80–5.20)
RDW: 15.8 % — ABNORMAL HIGH (ref 11.5–14.5)
WBC: 10.2 10*3/uL (ref 3.6–11.0)

## 2014-03-30 LAB — PROT IMMUNOELECTROPHORES(ARMC)

## 2014-03-30 LAB — PREGNANCY, URINE: Pregnancy Test, Urine: NEGATIVE m[IU]/mL

## 2014-03-31 LAB — CBC WITH DIFFERENTIAL/PLATELET
Basophil #: 0 10*3/uL (ref 0.0–0.1)
Basophil %: 0.1 %
EOS ABS: 0 10*3/uL (ref 0.0–0.7)
Eosinophil %: 0.1 %
HCT: 28 % — AB (ref 35.0–47.0)
HGB: 9.3 g/dL — AB (ref 12.0–16.0)
LYMPHS ABS: 1.4 10*3/uL (ref 1.0–3.6)
LYMPHS PCT: 8.9 %
MCH: 29.8 pg (ref 26.0–34.0)
MCHC: 33.2 g/dL (ref 32.0–36.0)
MCV: 90 fL (ref 80–100)
MONOS PCT: 4.5 %
Monocyte #: 0.7 x10 3/mm (ref 0.2–0.9)
NEUTROS ABS: 13.3 10*3/uL — AB (ref 1.4–6.5)
Neutrophil %: 86.4 %
Platelet: 335 10*3/uL (ref 150–440)
RBC: 3.11 10*6/uL — ABNORMAL LOW (ref 3.80–5.20)
RDW: 16.6 % — ABNORMAL HIGH (ref 11.5–14.5)
WBC: 15.4 10*3/uL — AB (ref 3.6–11.0)

## 2014-04-05 LAB — CBC CANCER CENTER
Basophil #: 0 x10 3/mm (ref 0.0–0.1)
Basophil %: 0.7 %
EOS ABS: 0.3 x10 3/mm (ref 0.0–0.7)
Eosinophil %: 4 %
HCT: 32.4 % — AB (ref 35.0–47.0)
HGB: 10.5 g/dL — ABNORMAL LOW (ref 12.0–16.0)
Lymphocyte #: 2.1 x10 3/mm (ref 1.0–3.6)
Lymphocyte %: 29.4 %
MCH: 29.9 pg (ref 26.0–34.0)
MCHC: 32.5 g/dL (ref 32.0–36.0)
MCV: 92 fL (ref 80–100)
MONO ABS: 0.9 x10 3/mm (ref 0.2–0.9)
Monocyte %: 12 %
Neutrophil #: 3.8 x10 3/mm (ref 1.4–6.5)
Neutrophil %: 53.9 %
PLATELETS: 289 x10 3/mm (ref 150–440)
RBC: 3.53 10*6/uL — ABNORMAL LOW (ref 3.80–5.20)
RDW: 17.9 % — AB (ref 11.5–14.5)
WBC: 7.1 x10 3/mm (ref 3.6–11.0)

## 2014-04-26 ENCOUNTER — Ambulatory Visit: Payer: Self-pay | Admitting: Hematology and Oncology

## 2014-05-24 ENCOUNTER — Telehealth: Payer: Self-pay | Admitting: *Deleted

## 2014-05-24 NOTE — Telephone Encounter (Signed)
Pt called and needed refills on Ambien.  I have called in 2 refills to her pharmacy.  Pt aware.

## 2014-05-25 ENCOUNTER — Ambulatory Visit
Admit: 2014-05-25 | Disposition: A | Discharge status: Home / Self Care | Payer: Self-pay | Attending: Hematology and Oncology | Admitting: Hematology and Oncology

## 2014-05-30 ENCOUNTER — Emergency Department: Payer: Self-pay | Admitting: Student

## 2014-06-25 ENCOUNTER — Ambulatory Visit
Admit: 2014-06-25 | Disposition: A | Discharge status: Home / Self Care | Payer: Self-pay | Attending: Hematology and Oncology | Admitting: Hematology and Oncology

## 2014-07-13 ENCOUNTER — Encounter: Payer: BC Managed Care – PPO | Admitting: Nurse Practitioner

## 2014-07-19 LAB — SURGICAL PATHOLOGY

## 2014-07-21 NOTE — Op Note (Signed)
PATIENT NAME:  Kelly Colon, Kelly Colon MR#:  [id number] DATE OF BIRTH:  22-Oct-1975  DATE OF PROCEDURE:  03/11/2014  PREOPERATIVE DIAGNOSIS: Multinodular goiter with dysphagia.   POSTOPERATIVE DIAGNOSIS: Multinodular goiter with dysphagia.   PROCEDURE PERFORMED: Total thyroidectomy with laryngeal nerve monitoring.   ANESTHESIA: General endotracheal anesthesia.   ESTIMATED BLOOD LOSS: 25 mL.   INTRAVENOUS FLUIDS: Please see anesthesia record.   COMPLICATIONS: None.   DRAINS AND STENT PLACEMENTS: Bilateral Surgicel.   SPECIMENS: Total thyroid.   SURGEON:  Dr. Bud Face.   ASSISTANT: Dr. Linus Salmons.   INDICATIONS FOR PROCEDURE: The patient is a 39 year old female with history of thyroid nodules left greater than right, presenting for total thyroidectomy.   OPERATIVE FINDINGS: Bilateral superior and inferior parathyroid glands were identified and preserved. Left recurrent laryngeal nerve was identified and robustly stimulated. The right recurrent laryngeal nerve was underneath some scar tissue possibly from previous FNA, was  able to be stimulated but was not tracked inferiorly from its insertion site due to its adherence to scar tissue.   DESCRIPTION OF PROCEDURE: After the patient was identified in holding, benefits and risks of the procedure were discussed and consent was reviewed. The patient taken to the operating room and placed in the supine position. General endotracheal anesthesia was induced with recurrent laryngeal nerve monitor in place.  The patient was injected with 8 mL of 0.25% plain Marcaine 1:200,000 epinephrine and was prepped and draped in a sterile fashion. A 15 blade scalpel was used to make a horizontal neck incision along the previously marked skin crease. Dissection was carried down through the platysma down to the strap muscles. These were divided along the median raphe.  The incision was slightly inferior to the isthmus of the thyroid, therefore dissection was  carried superiorly and the thyroid isthmus was identified and attention was directed to the patient's left side first. The left hemithyroid was dissected laterally and then superiorly and inferiorly using a blunt technique and the left superior thyroid pole was pedicled and isolated with blunt techniques and then divided using a Harmonic scalpel.  The remaining attachments of the left hemithyroid were separated inferiorly and then this was retracted medially with a Terris retractor and the left recurrent laryngeal nerve was identified in its tracheoesophageal groove. This was robustly stimulated. The superior and inferior parathyroid glands were identified and preserved. Attention was directed to the patient's right hemithyroid. The sternohyoid and sternothyroid muscles were separated from the patient's right hemithyroid and this was bluntly dissected laterally and then superiorly. This extended farther on up on the right side and superior pole was isolated and ligated using a Harmonic scalpel. Dissection was carried more inferiorly, blunt and sharp techniques. There was a structure coursing from inferior inserting into the larynx. This was bluntly dissected. This did not significant stimulate, however attempting to dissect through this was a significant amount of sticky scar tissue. Concern was injury to the nerve.  Near the insertion site the recurrent laryngeal nerve was stimulated with the stimulator, but it was not dissected more inferiorly for fear of devascularizing it.  The Berry ligament was divided. Care was taken to avoid injury of the right recurrent laryngeal nerve and a  superior and inferior parathyroid gland were identified and preserved. Remaining attachments of the thyroid were divided along the trachea with blunt and sharp techniques and this was passed off the table for permanent pathological evaluation and the right superior lobe was stitched with a marking stitch. At this time the wound  was  copiously irrigated with some sterile saline. Meticulous hemostasis was achieved. The recurrent laryngeal nerves were stimulated bilaterally. Again the right one was stimulated much weaker, however was not dissected along its entire course. Surgicel was placed along the thyroid wound bed bilaterally and then the strap muscles were reapproximated with a single figure-of-eight stitch and the subcutaneous tissue was reapproximated using 4-0 Vicryl in interrupted fashion and the skin was closed with Dermabond skin adhesive. At this time care of the patient was transferred to anesthesia.   ____________________________ Kyung Rudd, MD ccv:bu D: 03/11/2014 10:31:11 ET T: 03/11/2014 21:27:06 ET JOB#: [id number]  cc: Kyung Rudd, MD, <Dictator> Kyung Rudd MD ELECTRONICALLY SIGNED 04/07/2014 13:22

## 2014-07-21 NOTE — Consult Note (Signed)
PATIENT NAME:  Kelly Colon, Kelly Colon MR#:  [id number] DATE OF BIRTH:  Feb 22, 1976  DATE OF CONSULTATION:  03/27/2014  REFERRING PHYSICIAN:   CONSULTING PHYSICIAN:  Nada Libman, MD  PRIMARY CARE PHYSICIAN: [name], MD  REASON FOR CONSULTATION: Syncope.   HISTORY: This is a 39 year old female with a history of endometriosis. She recently underwent thyroidectomy for Hashimoto's on 03/11/2014. She was subsequently diagnosed with a DVT in the right leg on December 19. She was started on Xarelto. I spoke with her yesterday, and she had had 2 episodes where she nearly passed out. She also noted heavy vaginal bleeding. She had been on medication (hormonal therapy) for her endometriosis. This was thought to be the reason for her DVT and therefore it was stopped. She was seen by hematology on Friday who sent off blood work for a hypercoagulable workup. She told them that she was getting lightheaded and having heavy bleeding and the Xarelto was stopped. She had significant clots passed over the past 24 hours and with her lightheadedness and near syncope, she was told to come to the Emergency Department. Also she noted that when standing up, her heart rate would go up to 163.   REVIEW OF SYSTEMS: Please see admission history and physical, no changes.   PAST MEDICAL HISTORY: 1.  DVT, right leg.  2.  Hypothyroidism, status post thyroidectomy.  3.  Endometriosis.   SOCIAL HISTORY: No alcohol or tobacco.   FAMILY HISTORY: No significant cardiopulmonary issues.   ALLERGIES: MORPHINE.   HOME MEDICATIONS: Please see admission history and physical.   PHYSICAL EXAM:  VITAL SIGNS: Temperature 98.4, heart rate 96, blood pressure 123/76.  GENERAL: She is well appearing, in no distress.  HEENT: Normocephalic, atraumatic.  CARDIOVASCULAR: Tachycardic but regular.  ABDOMEN: Soft.  EXTREMITIES: Warm and well perfused.   PERTINENT DIAGNOSTIC STUDIES: Hemoglobin is 7.8, hematocrit 23.4, white blood cell count  9.6.   ASSESSMENT: 1.  Symptomatic anemia.  2.  Subacute right leg deep vein thrombosis.   PLAN: The patient was given 1 unit of blood transfusion overnight. She appears to be feeling a little bit better; however, she still has a low hemoglobin. She states that her clotting has decreased but does note she is having some abdominal pain. Currently the plan is to try her on IV heparin to see if her bleeding gets better as she progresses through her cycle. She is now approximately day 6. Usually she has a cycle that lasts about 3 days. Gynecology has been consulted to see what other options we have to treat her endometriosis and vaginal bleeding. Preferably, she will need to stay on anticoagulation; the duration will depend on her hypercoagulable workup. Currently the leading theory for her DVT is secondary to hormonal therapy, which has been stopped. We will discuss other options with gynecology as to how to treat this. She will be transitioned to Eliquis if she tolerates 24 hours of heparin. If for some reason she is unable to tolerate anticoagulation in the setting of a subacute DVT, we would need to consider an IVC filter. However, at this time, I would like for her to definitively fail anticoagulation before committing her to an IVC filter.   ____________________________ Nada Libman, MD vwb:ST D: 03/27/2014 19:18:11 ET T: 03/27/2014 22:00:01 ET JOB#: [id number]  cc: Nada Libman, MD, <Dictator> Nada Libman MD ELECTRONICALLY SIGNED 05/04/2014 23:08

## 2014-07-21 NOTE — Consult Note (Signed)
Brief Consult Note: Diagnosis: anemia,  iron deficiency,  recent dvt after thyroid surgery FOR HASHIMOTOS DISEASE , excessive vaginakl bleeding and blood loss anemia, hx endometriosis, hx osteopenia.   Patient was seen by consultant.   Orders entered.   Discussed with Attending MD.   Comments: SEE DISCTAED NOTE TO FOLLOW  BRIEFLY... HX ENDOMETRIOSIS, PRIOR TX LUPRON AND FOSAMAX, HAS OSTEOPENIA, RECENTLY ON ESTROGEN AND PROGESTERONE TAB FROM GYN [facility]).  THYROIDECTOMY 12/17.  ON 12/19 DVT LOWER EXT, SAW DR Lorretta Harp, STARTED XARALTO, STOPPED HORMONE TX. ON 12/21 PERIOD STARTED, BECAME HEAVY WITH CLOTS, AND PERSISTENT. SAW HEMATOLOGY DR Laurell Josephs ON 12/29, TOOK LAST XARALTO ON 12/30, HAD BLOOD CHECKED 12/31. ON 1/1 CAME TO ER WITH CONTINUED BLEEDING, AND SYMPTOMATIC ANEMIA, HGB AS LOW AS 7.2 G, HAS BEEN TRANSFUSED ONE UNIT PRBC. WAS GIVEN 10G PROVERA IN ER 1/1. TODAY SCANT SPOTTING, FEW SMALL CLOTS X 1 THIS AFTERNOON. HAS MILD RESTLESS LEGS....  PLAN.  1. RECHECK PT, PTT WHICH WERE RECENTLY NORMAL, PLUS HGB   2. RESTART PO IRON WHICH SHE WAS TAKING FOR 1 WEEK  3. CONSULT GYN, RE PROVERA, ? DAILY DOSING TO FOLLOW YESTRERDAYS BOLUS  4. DISCUSSED WITH MEDICINE DR Sampson Goon AND VASCULAR SURGERY DR Myra Gianotti. 5. IVC FILTER NOT CURRENTLY INDICATED, , INSTEAD WOULD RESTART ANTICOAGULATION, WITH HEPARIN DRIP,  AS PROVERA SHOULD CONTROL BLEEDING, BUT IS THROMBOGENIC, WATCH FOR BLEEDING, IF SIGNIFICANT BLOOD LOSS THEN WOULD STOP HEPARIN AND  NEED IVC FILTER  6.IF STABLE ON HEPARIN 24 TO 48 HRS, COULD THEN CHANGE TO ELAQUIS, AND IF STABLE ON ELAQUIS LATER DISCHARGE HOME  7. WILL WATCH BLEEDING, HGB, AND CONSIDER IV IRON.  Electronic Signatures: Marin Roberts (MD)  (Signed 02-Jan-16 11:57)  Authored: Brief Consult Note   Last Updated: 02-Jan-16 11:57 by Marin Roberts (MD)

## 2014-07-21 NOTE — H&P (Signed)
PATIENT NAME:  Kelly Colon, Kelly Colon MR#:  [id number] DATE OF BIRTH:  04-27-1975  DATE OF ADMISSION:  03/26/2014  REFERRING PHYSICIAN: Governor Rooks, MD  PRIMARY CARE PHYSICIAN: Bethann Punches, MD, [facility].   CHIEF COMPLAINT: Vaginal, bleeding, lightheadedness.   HISTORY OF PRESENT ILLNESS: A 39 year old Caucasian female with history of endometriosis, recent thyroidectomy for Hashimoto thyroiditis on 03/11/2014, then diagnosed with DVT on the right lower extremity on 12/19, started on Xarelto, who is now presenting with continued vaginal bleed and lightheadedness. Once again started on Xarelto for her new onset DVT. She was recently evaluated by hematology who apparently is doing a hypercoagulable workup, took about 27 vials of blood. She has her normal menstrual cycle. She has endometriosis at baseline and always had heavy vaginal bleeding. However, after starting Xarelto, noticed continuous frank clots for the last 4-5 day duration. Now has lightheadedness, particularly when going from sitting to standing, as well as palpitations. Denies any shortness of breath or chest pain.   REVIEW OF SYSTEMS:  CONSTITUTIONAL: Denies any fevers, chills. Positive fatigue, weakness.  EYES: Denies blurred vision, double vision, eye pain.  EARS, NOSE, THROAT: Denies tinnitus, ear pain, hearing loss.  RESPIRATORY: Denies cough, wheeze, shortness of breath.  CARDIOVASCULAR: Denies chest pain. Positive for palpitations.  GASTROINTESTINAL: Denies nausea, vomiting, diarrhea, abdominal pain.  GENITOURINARY: Denies dysuria or hematuria. Positive vaginal bleeding as described above.  ENDOCRINE: Denies nocturia or thyroid problems.  HEMATOLOGIC AND LYMPHATIC: Positive for bleeding as stated above.  SKIN: Denies rashes or lesions.  MUSCULOSKELETAL: Denies pain in neck, back, shoulders, knees, hips, or arthritic symptoms.  NEUROLOGIC: Denies paralysis or paresthesias.  PSYCHIATRIC: Denies anxiety or depressive symptoms.    Otherwise, full review of systems performed by me is negative.   PAST MEDICAL HISTORY: New onset right lower extremity DVT diagnosed 03/13/2014, as well as hypothyroidism after a thyroidectomy, endometriosis.   SOCIAL HISTORY: Denies any alcohol, tobacco, or drug use.   FAMILY HISTORY: Denies any known cardiovascular or pulmonary disorders or clotting disorders.   ALLERGIES: MORPHINE.   HOME MEDICATIONS: Include Maxalt 10 mg daily as needed, Ambien 12.5 mg extended release at bedtime, hydroxyzine 25 mg 4 times daily as needed, levothyroxine 100 mcg daily, calcium plus vitamin D 500 mg and 200 international units 2 tablets 3 times daily for 1 week followed by 2 tabs calcium plus vitamin of D 500 mg and 200 international units daily.   PHYSICAL EXAMINATION:  VITAL SIGNS: Temperature 98.8, heart rate 121, respirations 20, blood pressure 100/76, saturating 98% on room air. Weight 59.4 kg, BMI 24.  GENERAL: Well-nourished, well-developed Caucasian female, currently in no acute distress.  HEAD: Normocephalic, atraumatic.  EYES: Pupils equal, round, reactive to light. Extraocular muscles intact. No scleral icterus. Pale conjunctivae.  MOUTH: Moist mucosal membrane. Dentition intact. No abscess noted.  EARS, NOSE, THROAT: Clear without exudates. No external lesions.  NECK: Supple. No thyromegaly. No nodules. No JVD.  PULMONARY: Clear to auscultation bilaterally without wheeze, rales, or rhonchi. No use of accessory muscles.  Good respiratory effort. CHEST: Nontender to palpation.  CARDIOVASCULAR: S1, S2, tachycardic. No murmurs, rubs, or gallops. No edema. Pedal pulses 2+ bilaterally.  GASTROINTESTINAL: Soft, nontender, nondistended. No masses. Positive bowel sounds. No hepatosplenomegaly.  MUSCULOSKELETAL: No swelling, clubbing, or edema. Range of motion full in all extremities.  NEUROLOGIC: Cranial nerves II through XII intact. No gross focal neurologic deficits. Sensation intact. Reflexes  intact.  SKIN: No ulcerations, lesions, rashes, or cyanosis. Skin warm, dry. Turgor intact.  PSYCHIATRIC:  Mood and affect within normal limits. The patient is awake, alert, oriented x 3. Insight and judgment intact.   LABORATORY DATA: Sodium 141, potassium 3.9, chloride 109, bicarbonate 32, BUN 9, creatinine 0.71, glucose 96. WBC 13.1, hemoglobin 8, performed on December 31st was 9.8, platelets of 327,000.   ASSESSMENT AND PLAN: A 39 year old Caucasian female with history of endometriosis, hypothyroidism, recently diagnosed right lower extremity deep vein thrombosis presenting with vaginal bleed, lightheadedness.  1.  Symptomatic anemia secondary to vaginal bleed with multiple blood draws, and she is on Xarelto. Transfuse 1 unit of packed blood cells now. CBCs every 6 hours. We will also consult hematology.  2.  Recent deep vein thrombosis in the right lower extremity. She is on Xarelto therapy. She stopped taking this 24 hours ago. We will consult hematology once again for further recommendations regarding bleeding and new deep venous thrombosis. She will need anticoagulants for the next 6 months.  3.  Hypothyroidism, status post thyroidectomy: Synthroid.  4.  Venous thromboembolism prophylaxis: Sequential compression devices.   CODE STATUS: The patient is FULL CODE.   TIME SPENT: 45 minutes.    ____________________________ Cletis Athens. Ylianna Almanzar, MD dkh:ts D: 03/26/2014 23:44:05 ET T: 03/27/2014 00:31:14 ET JOB#: [id number]  cc: Cletis Athens. Aminah Zabawa, MD, <Dictator> Sahar Ryback Synetta Shadow MD ELECTRONICALLY SIGNED 03/27/2014 22:11

## 2014-07-25 NOTE — Consult Note (Signed)
PATIENT NAME:  Kelly Colon, Kelly Colon MR#:  [id number] DATE OF BIRTH:  1975/06/24  DATE OF CONSULTATION:  03/27/2014  REFERRING PHYSICIAN:   CONSULTING PHYSICIAN:  Knute Neu. Lorre Nick, MD  HISTORY OF PRESENT ILLNESS:  Kelly Colon was admitted on January 1. I saw and evaluated her on January 2, discussed with medicine and consultants, placed note on the chart, this full narrative is delayed until the current day. Essentially this patient who had thyroid surgery for thyroiditis, on December 17, was diagnosed with tibial DVTs on December 19 and placed on Xarelto.  She saw Dr. Eda Keys in the cancer center for outpatient evaluation of clot on December 29 when multiple coagulation studies were performed. She was having heavy clots and bleeding at that time. Xarelto was held for 1 day. Notably a week earlier she had stopped her hormone therapy for endometriosis. In retrospect it appeared, and this was also the opinion of vascular surgery and gynecology, that the heavy bleeding was not from the anticoagulation, but from the hormonal changes. On December 1 patient was having significant blood loss anemia and presyncope. She was admitted with anemia, given a unit of blood. Xarelto was initially held again.  Hematology was consulted for patient bleeding with a recent clot. The patient had tachycardia and symptomatic anemia initially which was subsequently improved with IV fluids and with the unit of blood. When seen on January 2, the patient did not have headache or dizziness, chills or sweats, visual disturbances, ear or jaw pain, shortness of breath, retrosternal chest pain, palpitations, abdominal pain, nausea, vomiting, diarrhea. Was still having vaginal bleeding, but was already improved, subsequent to having been given Provera in the Emergency Room on January 1. She still had scant spotting and a few small clots the day she was seen. She had mild restless legs. She had previously had blood loss, heavy menses, hormones had  been regulating the flow. The patient has low ferritin from blood loss, but did not have iron deficiency by the iron saturation. She had been on p.o. iron in the past, but had stopped that within the last week. There was no other back or bone pain. No significant pain in the leg. No cough or wheezing. No hemoptysis or epistaxis.   PAST MEDICAL HISTORY: Also reviewed on admission and in Dr. 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 Multiple hypercoagulation studies were done on December 29.    FAMILY HISTORY: Notable that mother had had pulmonary embolism.  SOCIAL HISTORY:  The patient is not an alcohol drinker, no smoking.   HOME MEDICATIONS: Had already been noted and included Maxalt 10 mg daily p.r.n., Ambien 12.5 mg extended-release at night, hydroxyzine 25 mg 4 times a day p.r.n., levothyroxine 100 mcg daily, calcium with vitamin D daily.   PHYSICAL EXAMINATION:  GENERAL: The patient was alert and cooperative.  HEENT: Sclerae clear. Some pallor. Mouth, no thrush.  LYMPH: No palpable lymph nodes in the neck, submandibular, or axilla.   LUNGS: Clear. No wheezing, rales, or rhonchi.  HEART: Regular.  ABDOMEN: Nontender. No palpable mass or organomegaly.  EXTREMITIES: No extremity edema.  NEUROLOGIC: Grossly nonfocal. Cranial nerves intact. Moving all extremities.   mood and affect unremarkable.   LABORATORY DATA:  On admission the creatinine was 0.71. White  count 13.1, hemoglobin was 8, platelets were 327,000.   On January 2 also on admission the TSH was normal at 1.7. The baseline PT/INR was normal at 1.1, the baseline PTT was normal at 25 seconds, Xarelto had been held for 1 day. Later on  January 3 hemoglobin fell.  The white count subsequently normalized. The platelets remained normal. Also available at a later time results of multiple hypercoagulable studies were all unremarkable except for a borderline protein C antigen at 73 with a normal protein C functional of over 100%.   On the day of evaluation I also discussed with medicine and with vascular surgery and we consulted and the patient was later seen by gynecology as well. Note that the determination was made that the patient should have a heparin drip and later go on Eliquis, it did not look like anticoagulation was causing the bleeding, it looked like hormonal manipulations, and the patient was then cleared to continue usual anticoagulation. Follow up could be with hematology or medicine for length of anticoagulation and of course GYN followup for hormone management treatment and endometriosis, medical followup for autoimmune disease, the patient has a positive ANA, has other issues including Crohn disease and osteoporosis as already noted.     ____________________________ Knute Neu Lorre Nick, MD rgg:bu D: 04/27/2014 18:36:00 ET T: 04/27/2014 19:25:19 ET JOB#: [id number]  cc: Knute Neu. Lorre Nick, MD, <Dictator> Marin Roberts MD ELECTRONICALLY SIGNED 05/01/2014 22:07

## 2014-07-25 NOTE — Discharge Summary (Signed)
PATIENT NAME:  Kelly Colon, Kelly Colon MR#:  [id number] DATE OF BIRTH:  Jun 13, 1975  DATE OF ADMISSION:  03/28/2014 DATE OF DISCHARGE:  03/31/2014  DISCHARGE DIAGNOSES:  1.  Severe vaginal bleeding with acute blood loss anemia.  2.  Right calf deep vein thrombosis.  3.  Hashimoto thyroiditis, postoperative thyroidectomy.  4.  Endometriosis.   DISCHARGE MEDICATIONS: Ambien 12.5 mg at bedtime, Synthroid 100 mcg daily, Astepro 2 sprays b.i.d., Maxalt 10 mg daily p.r.n. hydroxyzine 25 mg q.i.d. p.r.n., Eliquis 5 mg b.i.d.   REASON FOR ADMISSION: A 39 year old female presents with severe vaginal bleeding with severe anemia, on Xarelto for DVT. Please see History and Physical for HPI, past medical history, and physical exam.   HOSPITAL COURSE: The patient was admitted, transfused with 1 unit of PRBC. She had been on Xarelto for her right calf DVT, which showed no extension above the popliteal space. The Xarelto was discontinued. She was given IV iron. Her hemoglobin came up to 9.3. She continued to have substantial vaginal bleeding despite 40 mg b.i.d. of Provera. She underwent endometrial ablation and polypectomy per Dr. Leeroy Bock Ward of OB/GYN. She had no subsequent bleeding and was doing well and her hemoglobin was stable. She will be going home on Eliquis. There was no indication for IVC filter and even a nominal indication for the Factor X inhibitor and the fact that this was a calf DVT only. She will likely not need a full 3 months therapy.   Hypercoagulable workup negative thus far. We will follow up a D-dimer and ultrasound as an outpatient.   FOLLOW-UP:  Follow-up with Dr. Hyacinth Meeker in 1 week.  ____________________________ [name], MD mfm:AT D: 03/31/2014 16:07:00 ET T: 03/31/2014 22:43:10 ET JOB#: [id number] MARK F MILLER MD ELECTRONICALLY SIGNED 04/01/2014 19:17

## 2014-07-25 NOTE — Op Note (Signed)
PATIENT NAME:  Kelly Colon, WESSELLS MR#:  [id number] DATE OF BIRTH:  11-27-1975  DATE OF PROCEDURE:  03/30/2014  PREOPERATIVE DIAGNOSIS: Vaginal bleeding.   POSTOPERATIVE DIAGNOSIS: Vaginal bleeding  PROCEDURES: Dilation and curettage, hysteroscopy, and NovaSure endometrial ablation.   SURGEON: Chelsea C. Ward, MD  ANESTHESIA: General via LMA.   Operative Fluids: Crystalloid 500 mL.   ESTIMATED BLOOD LOSS: Minimal.   URINE OUTPUT: None   FINDINGS:  1.  Small, anteverted, mobile uterus.  2.  Normal-appearing cervix with large transition zone visible.  3. Small cervical polyp on anterior surface of ectocervix at 11 o'clock  about 2 cm from the os towards the interior fornix.  4.  Uterus sounded to 7 cm.  5.  Large polypoid and hemorrhagic polyp seemingly detached within the uterine cavity. A fair amount of tissue returned on curettage.  6.  Bilateral ostia visualized.  7.  Cervical length 5 cm, uterine cavity width 4.4 cm. NovaSure burn time 1 minute 58 seconds with good char on NovaSure mesh post procedure.  8.  No visible areas of raw endometrium post ablation.   SPECIMENS: Endometrial curettings.   COMPLICATIONS: None apparent.   DISPOSITION: Stable to PACU for recovery.  INDICATIONS FOR PROCEDURE: The patient is a 39 year old, G0, who has been being treated for endometriosis since the age of 68 with a variety of modalities. She was also on Aygestin for symptoms of persistent vaginal bleeding in between regular cycles. On 03/11/2014 she was brought to the operating room for a thyroidectomy, which was completed and 2 days after this was she was diagnosed with a DVT. She was given full anticoagulation on the same day she started her period and stopped taking the progesterone agent. These 3 occurrences at the same time lead to profuse vaginal bleeding and large clots and acute blood loss anemia. Her hemoglobin nadir was 7.3. She was given a blood transfusion of 1 unit of packed red blood  cells and is also status post an iron infusion. She was started on Provera for hormonal attempts to stop her vaginal bleeding; however, with increasing doses still the bleeding persisted. She was then counseled that a possible solution to this would be to do a D and C to remove the endometrial tissue, as well as an endometrial ablation in order to cease the bleeding altogether and coagulate the endometrial cavity. The patient agreed and the surgery was scheduled while she was inpatient. Informed consent was obtained.  PROCEDURE IN DETAIL: The patient was brought to the operating room and identified as Kelly Colon. She was placed in the supine position and an LMA was positioned for general anesthesia. She was then positioned in the dorsal lithotomy position with candy cane stirrups with SCDs on the left leg, but not the right due to the presence of her DVT and prevention of propagation of the already existing clot. She was then prepped in the vagina with betadine and a timeout was called. A bimanual exam was performed with notation of a small, anteverted uterus, and no significant adnexal masses appreciated. A weighted speculum was placed in the vagina and then a Sims retractor was used to visualize the cervix, which was grasped at the 12 o'clock position with a single-tooth tenaculum. It was noted that there was a small polyp in this area on the cervix, but on the anterior surface of the cervix tucked towards the anterior fornix. The cervix was then brought forward and sounded to 7 cm. The [facility] dilators were then used  to dilate the cervix in order to accommodate the hysteroscope, which was inserted and findings noted as above. The hysteroscope was removed and a gentle curettage was performed with a decent amount of tissue returned. This was collected and sent as endometrial curettings. The hysteroscope was reinserted and the large polypoid tissue as well as the fluffy endometrial tissue were all seen and noted to  be absent. At this time the NovaSure device was inserted into the endometrial cavity and engaged. Test was performed for integrity of the cavity and the suction and this was confirmed and char was performed for 1 minute 58 seconds when the device automatically shut off. The NovaSure device was left in the uterus opened for 30 seconds and then slowly withdrawn in order to prevent dislodging the device. The mesh was inspected and there was a good char on all surfaces. The hysteroscope was then reinserted into the endometrial cavity to visualize the remainder of the endometrium and there were no visible areas that were pink or bleeding. At this time the procedure was deemed complete. The instruments were removed from the patient. There was some brisk bleeding from the tenaculum site and 4 minutes of tamponade was used with a ring forceps; however, this was not sufficient in containing the bleeding, so silver nitrate was placed at the puncture sites for each side of the tenaculum and hemostasis was obtained. The patient was then recovered, taken out of dorsal lithotomy position, and sent to the PACU for recovery.    ____________________________ Kelly Fender Ward, MD ccw:bm D: 03/30/2014 19:11:42 ET T: 03/30/2014 22:56:04 ET JOB#: [id number]  cc: Leeroy Bock C. Ward, MD, <Dictator> Leola Brazil MD ELECTRONICALLY SIGNED 04/04/2014 9:15

## 2014-08-06 DIAGNOSIS — E782 Mixed hyperlipidemia: Secondary | ICD-10-CM | POA: Insufficient documentation

## 2014-09-14 ENCOUNTER — Encounter: Payer: Self-pay | Admitting: Physician Assistant

## 2015-01-13 ENCOUNTER — Encounter: Payer: Self-pay | Admitting: *Deleted

## 2015-01-14 ENCOUNTER — Encounter: Payer: Self-pay | Admitting: *Deleted

## 2015-01-14 ENCOUNTER — Ambulatory Visit: Payer: BLUE CROSS/BLUE SHIELD | Admitting: Anesthesiology

## 2015-01-14 ENCOUNTER — Ambulatory Visit
Admission: RE | Admit: 2015-01-14 | Discharge: 2015-01-14 | Disposition: A | Discharge status: Home / Self Care | Payer: BLUE CROSS/BLUE SHIELD | Source: Ambulatory Visit | Attending: Unknown Physician Specialty | Admitting: Unknown Physician Specialty

## 2015-01-14 ENCOUNTER — Encounter
Admission: RE | Disposition: A | Discharge status: Home / Self Care | Payer: Self-pay | Source: Ambulatory Visit | Attending: Unknown Physician Specialty

## 2015-01-14 DIAGNOSIS — Z79899 Other long term (current) drug therapy: Secondary | ICD-10-CM | POA: Insufficient documentation

## 2015-01-14 DIAGNOSIS — Z86718 Personal history of other venous thrombosis and embolism: Secondary | ICD-10-CM | POA: Diagnosis not present

## 2015-01-14 DIAGNOSIS — Z7982 Long term (current) use of aspirin: Secondary | ICD-10-CM | POA: Diagnosis not present

## 2015-01-14 DIAGNOSIS — K5090 Crohn's disease, unspecified, without complications: Secondary | ICD-10-CM | POA: Diagnosis present

## 2015-01-14 DIAGNOSIS — E785 Hyperlipidemia, unspecified: Secondary | ICD-10-CM | POA: Diagnosis not present

## 2015-01-14 DIAGNOSIS — E039 Hypothyroidism, unspecified: Secondary | ICD-10-CM | POA: Diagnosis not present

## 2015-01-14 DIAGNOSIS — M8580 Other specified disorders of bone density and structure, unspecified site: Secondary | ICD-10-CM | POA: Diagnosis not present

## 2015-01-14 DIAGNOSIS — Z09 Encounter for follow-up examination after completed treatment for conditions other than malignant neoplasm: Secondary | ICD-10-CM | POA: Diagnosis not present

## 2015-01-14 DIAGNOSIS — Z8719 Personal history of other diseases of the digestive system: Secondary | ICD-10-CM | POA: Insufficient documentation

## 2015-01-14 DIAGNOSIS — K640 First degree hemorrhoids: Secondary | ICD-10-CM | POA: Diagnosis not present

## 2015-01-14 DIAGNOSIS — G43909 Migraine, unspecified, not intractable, without status migrainosus: Secondary | ICD-10-CM | POA: Diagnosis not present

## 2015-01-14 HISTORY — DX: Acute embolism and thrombosis of unspecified deep veins of unspecified lower extremity: I82.409

## 2015-01-14 HISTORY — DX: Crohn's disease, unspecified, without complications: K50.90

## 2015-01-14 HISTORY — DX: Headache, unspecified: R51.9

## 2015-01-14 HISTORY — DX: Gastric ulcer, unspecified as acute or chronic, without hemorrhage or perforation: K25.9

## 2015-01-14 HISTORY — DX: Headache: R51

## 2015-01-14 HISTORY — DX: Hyperlipidemia, unspecified: E78.5

## 2015-01-14 HISTORY — DX: Age-related osteoporosis without current pathological fracture: M81.0

## 2015-01-14 HISTORY — PX: COLONOSCOPY WITH PROPOFOL: SHX5780

## 2015-01-14 SURGERY — COLONOSCOPY WITH PROPOFOL
Anesthesia: General

## 2015-01-14 MED ORDER — SODIUM CHLORIDE 0.9 % IV SOLN: INTRAVENOUS | Status: DC

## 2015-01-14 MED ORDER — PROPOFOL 500 MG/50ML IV EMUL
INTRAVENOUS | Status: DC | PRN
  Administered 2015-01-14: 140 ug/kg/min via INTRAVENOUS

## 2015-01-14 MED ORDER — PROPOFOL 10 MG/ML IV BOLUS
INTRAVENOUS | Status: DC | PRN
  Administered 2015-01-14: 50 mg via INTRAVENOUS

## 2015-01-14 MED ORDER — FENTANYL CITRATE (PF) 100 MCG/2ML IJ SOLN
INTRAMUSCULAR | Status: DC | PRN
  Administered 2015-01-14 (×2): 50 ug via INTRAVENOUS

## 2015-01-14 MED ORDER — MIDAZOLAM HCL 2 MG/2ML IJ SOLN
INTRAMUSCULAR | Status: DC | PRN
  Administered 2015-01-14 (×2): 1 mg via INTRAVENOUS

## 2015-01-14 MED ORDER — LIDOCAINE HCL (CARDIAC) 20 MG/ML IV SOLN
INTRAVENOUS | Status: DC | PRN
  Administered 2015-01-14: 30 mg via INTRAVENOUS

## 2015-01-14 MED ORDER — SODIUM CHLORIDE 0.9 % IV SOLN
INTRAVENOUS | Status: DC
  Administered 2015-01-14: 16:00:00 via INTRAVENOUS
  Administered 2015-01-14: 1000 mL via INTRAVENOUS

## 2015-01-14 NOTE — Anesthesia Procedure Notes (Signed)
Date/Time: 01/14/2015 3:56 PM Performed by: Stormy Fabian Pre-anesthesia Checklist: Patient identified, Emergency Drugs available, Suction available and Patient being monitored Patient Re-evaluated:Patient Re-evaluated prior to inductionOxygen Delivery Method: Nasal cannula Intubation Type: IV induction Dental Injury: Teeth and Oropharynx as per pre-operative assessment  Comments: Nasal cannula with etCO2 monitoring

## 2015-01-14 NOTE — Anesthesia Postprocedure Evaluation (Signed)
  Anesthesia Post-op Note  Patient: Kelly Colon  Procedure(s) Performed: Procedure(s): COLONOSCOPY WITH PROPOFOL (N/A)  Anesthesia type:General  Patient location: PACU  Post pain: Pain level controlled  Post assessment: Post-op Vital signs reviewed, Patient's Cardiovascular Status Stable, Respiratory Function Stable, Patent Airway and No signs of Nausea or vomiting  Post vital signs: Reviewed and stable  Last Vitals:  Filed Vitals:   01/14/15 1646  BP: 100/75  Pulse: 56  Temp:   Resp: 15    Level of consciousness: awake, alert  and patient cooperative  Complications: No apparent anesthesia complications

## 2015-01-14 NOTE — Op Note (Signed)
[facility] Patient Name: Kelly Colon Procedure Date: 01/14/2015 3:53 PM MRN: [id number] Account #: [id number] Date of Birth: [birth year] Admit Type: Outpatient Age: 39 Room: [facility] ENDO ROOM 1 Gender: Female Note Status: Finalized Procedure:         Colonoscopy Indications:       High risk colon cancer surveillance: Crohn's disease large                     intestine Providers:         Scot Jun, MD Referring MD:      [name], MD (Referring MD) Medicines:         Propofol per Anesthesia Complications:     No immediate complications. Procedure:         Pre-Anesthesia Assessment:                    - After reviewing the risks and benefits, the patient was                     deemed in satisfactory condition to undergo the procedure.                    After obtaining informed consent, the colonoscope was                     passed under direct vision. Throughout the procedure, the                     patient's blood pressure, pulse, and oxygen saturations                     were monitored continuously. The Colonoscope was                     introduced through the anus and advanced to the the cecum,                     identified by appendiceal orifice and ileocecal valve. The                     colonoscopy was performed without difficulty. The patient                     tolerated the procedure well. The quality of the bowel                     preparation was excellent. Findings:      Internal hemorrhoids were found during endoscopy. The hemorrhoids were       small and Grade I (internal hemorrhoids that do not prolapse).      The prep was excellent. The ileocecal valve was normal and the scope       passed into the terminal ileum. The colon and distal ileum showed normal       tissue except very slight irriation in rectum and this was biopsied       twice. Impression:        - Internal hemorrhoids.                    - No  specimens collected. Recommendation:    - Await pathology results. Scot Jun, MD 01/14/2015 4:24:59 PM This report has been signed electronically. Number of Addenda: 0 Note Initiated On: 01/14/2015 3:53 PM Scope Withdrawal  Time: 0 hours 11 minutes 8 seconds  Total Procedure Duration: 0 hours 18 minutes 10 seconds       [facility]

## 2015-01-14 NOTE — H&P (Signed)
Primary Care Physician:  [name]., MD Primary Gastroenterologist:  Dr. Mechele Collin  Pre-Procedure History & Physical: HPI:  Kelly Colon is a 39 y.o. female is here for an colonoscopy.   Past Medical History  Diagnosis Date  . Osteopenia   . Endometriosis   . Hypothyroidism   . Crohn's disease (HCC)   . DVT (deep venous thrombosis) (HCC)   . Headache     Migraine  . Hyperlipidemia   . Gastric ulcer   . Osteoporosis     Past Surgical History  Procedure Laterality Date  . Oophorectomy Left   . Laparoscopy      x 4, for treatment of endometriosis  . Pelvic laparoscopy      2005, 2007, 2010, 2013  . Thyroidectomy    . Appendectomy      Prior to Admission medications   Medication Sig Start Date End Date Taking? Authorizing Provider  Azelastine HCl 0.15 % SOLN Place 2 sprays into the nose 2 (two) times daily as needed.   Yes Historical Provider, MD  Budesonide (UCERIS) 9 MG TB24 Take 1 tablet by mouth daily.   Yes Historical Provider, MD  etodolac (LODINE) 400 MG tablet Take 400 mg by mouth 2 (two) times daily.   Yes Historical Provider, MD  hydrOXYzine (ATARAX/VISTARIL) 25 MG tablet Take 25 mg by mouth every 8 (eight) hours as needed for itching.   Yes Historical Provider, MD  levothyroxine (SYNTHROID, LEVOTHROID) 75 MCG tablet Take 75 mcg by mouth daily before breakfast.   Yes Historical Provider, MD  traMADol (ULTRAM) 50 MG tablet Take 50 mg by mouth every 6 (six) hours as needed.   Yes Historical Provider, MD  Vitamin E 14782 UNIT/60GM CREA Apply 1 Units topically daily.   Yes Historical Provider, MD  butalbital-aspirin-caffeine [facility]) 50-325-40 MG per capsule Take by mouth.    Historical Provider, MD  calcium-vitamin D 250-100 MG-UNIT per tablet Take 2 tablets by mouth 2 (two) times daily.    Historical Provider, MD  escitalopram (LEXAPRO) 10 MG tablet Take by mouth. 01/06/14 04/06/14  Historical Provider, MD  imiquimod (ALDARA) 5 % cream APPLY TOPICALLY 3 (THREE)  TIMES A WEEK.    Reva Bores, MD  norethindrone (AYGESTIN) 5 MG tablet 1/2 tablet by mouth daily. 01/29/14   Historical Provider, MD  rizatriptan (MAXALT) 10 MG tablet Take 1 tablet (10 mg total) by mouth as needed for migraine. May repeat in 2 hours if needed 12/31/13   Delbert Phenix, NP  zolpidem (AMBIEN CR) 12.5 MG CR tablet Take 1 tablet (12.5 mg total) by mouth at bedtime as needed for sleep. 02/02/14   Delbert Phenix, NP    Allergies as of 12/21/2014  . (No Known Allergies)    Family History  Problem Relation Age of Onset  . Endometriosis Mother   . Stroke Mother   . Heart attack Father   . Heart attack Paternal Grandmother   . Cancer Paternal Grandfather     throat, stomach    Social History   Social History  . Marital Status: Single    Spouse Name: N/A  . Number of Children: N/A  . Years of Education: N/A   Occupational History  . Not on file.   Social History Main Topics  . Smoking status: Never Smoker   . Smokeless tobacco: Never Used  . Alcohol Use: No  . Drug Use: No  . Sexual Activity: No   Other Topics Concern  . Not on  file   Social History Narrative    Review of Systems: See HPI, otherwise negative ROS  Physical Exam: BP 127/87 mmHg  Pulse 74  Temp(Src) 98.9 F (37.2 C) (Tympanic)  Resp 18  Ht  (1.575 m)  Wt 53.524 kg (118 lb)  BMI 21.58 kg/m2  SpO2 100% General:   Alert,  pleasant and cooperative in NAD Head:  Normocephalic and atraumatic. Neck:  Supple; no masses or thyromegaly. Lungs:  Clear throughout to auscultation.    Heart:  Regular rate and rhythm. Abdomen:  Soft, nontender and nondistended. Normal bowel sounds, without guarding, and without rebound.   Neurologic:  Alert and  oriented x4;  grossly normal neurologically.  Impression/Plan: [name] is here for an colonoscopy to be performed for follow up Crohn's disease  Risks, benefits, limitations, and alternatives regarding  colonoscopy have been reviewed with  the patient.  Questions have been answered.  All parties agreeable.   Lynnae Prude, MD  01/14/2015, 3:54 PM

## 2015-01-14 NOTE — Transfer of Care (Signed)
Immediate Anesthesia Transfer of Care Note  Patient: Kelly Colon  Procedure(s) Performed: Procedure(s): COLONOSCOPY WITH PROPOFOL (N/A)  Patient Location: PACU  Anesthesia Type:General  Level of Consciousness: sedated  Airway & Oxygen Therapy: Patient Spontanous Breathing and Patient connected to face mask oxygen  Post-op Assessment: Report given to RN and Post -op Vital signs reviewed and stable  Post vital signs: Reviewed and stable  Last Vitals:  Filed Vitals:   01/14/15 1626  BP: 101/62  Pulse:   Temp: 36.3 C  Resp: 16    Complications: No apparent anesthesia complications

## 2015-01-14 NOTE — Anesthesia Preprocedure Evaluation (Signed)
Anesthesia Evaluation  Patient identified by MRN, date of birth, ID band Patient awake    Reviewed: Allergy & Precautions, NPO status , Patient's Chart, lab work & pertinent test results  History of Anesthesia Complications Negative for: history of anesthetic complications  Airway Mallampati: II       Dental  (+) Teeth Intact   Pulmonary neg pulmonary ROS,           Cardiovascular + DVT       Neuro/Psych Anxiety negative neurological ROS     GI/Hepatic Neg liver ROS, PUD,   Endo/Other  Hypothyroidism   Renal/GU negative Renal ROS     Musculoskeletal   Abdominal   Peds  Hematology negative hematology ROS (+) anemia ,   Anesthesia Other Findings   Reproductive/Obstetrics                             Anesthesia Physical Anesthesia Plan  ASA: II  Anesthesia Plan: General   Post-op Pain Management:    Induction: Intravenous  Airway Management Planned: Nasal Cannula  Additional Equipment:   Intra-op Plan:   Post-operative Plan:   Informed Consent: I have reviewed the patients History and Physical, chart, labs and discussed the procedure including the risks, benefits and alternatives for the proposed anesthesia with the patient or authorized representative who has indicated his/her understanding and acceptance.     Plan Discussed with:   Anesthesia Plan Comments:         Anesthesia Quick Evaluation

## 2015-01-18 LAB — SURGICAL PATHOLOGY

## 2015-01-20 ENCOUNTER — Encounter: Payer: Self-pay | Admitting: Unknown Physician Specialty

## 2015-03-27 HISTORY — PX: ABDOMINAL HYSTERECTOMY: SHX81

## 2015-10-19 DIAGNOSIS — E8940 Asymptomatic postprocedural ovarian failure: Secondary | ICD-10-CM | POA: Insufficient documentation

## 2016-06-15 ENCOUNTER — Other Ambulatory Visit: Payer: Self-pay | Admitting: Internal Medicine

## 2016-06-15 DIAGNOSIS — Z1231 Encounter for screening mammogram for malignant neoplasm of breast: Secondary | ICD-10-CM

## 2016-07-13 ENCOUNTER — Other Ambulatory Visit: Payer: Self-pay | Admitting: Internal Medicine

## 2016-07-13 ENCOUNTER — Ambulatory Visit
Admission: RE | Admit: 2016-07-13 | Discharge: 2016-07-13 | Disposition: A | Discharge status: Home / Self Care | Payer: 59 | Source: Ambulatory Visit | Attending: Internal Medicine | Admitting: Internal Medicine

## 2016-07-13 DIAGNOSIS — Z1231 Encounter for screening mammogram for malignant neoplasm of breast: Secondary | ICD-10-CM | POA: Diagnosis present

## 2017-06-24 ENCOUNTER — Other Ambulatory Visit: Payer: Self-pay | Admitting: Internal Medicine

## 2017-06-28 ENCOUNTER — Other Ambulatory Visit: Payer: Self-pay | Admitting: Internal Medicine

## 2017-06-28 DIAGNOSIS — Z1231 Encounter for screening mammogram for malignant neoplasm of breast: Secondary | ICD-10-CM

## 2017-07-19 ENCOUNTER — Ambulatory Visit
Admission: RE | Admit: 2017-07-19 | Discharge: 2017-07-19 | Disposition: A | Discharge status: Home / Self Care | Payer: BLUE CROSS/BLUE SHIELD | Source: Ambulatory Visit | Attending: Internal Medicine | Admitting: Internal Medicine

## 2017-07-19 DIAGNOSIS — Z1231 Encounter for screening mammogram for malignant neoplasm of breast: Secondary | ICD-10-CM | POA: Diagnosis not present

## 2017-11-22 ENCOUNTER — Encounter: Payer: BLUE CROSS/BLUE SHIELD | Admitting: Obstetrics and Gynecology

## 2017-12-06 ENCOUNTER — Encounter: Payer: BLUE CROSS/BLUE SHIELD | Admitting: Certified Nurse Midwife

## 2017-12-06 ENCOUNTER — Encounter

## 2018-02-14 DIAGNOSIS — Z86718 Personal history of other venous thrombosis and embolism: Secondary | ICD-10-CM | POA: Insufficient documentation

## 2018-02-17 ENCOUNTER — Other Ambulatory Visit: Payer: Self-pay | Admitting: Internal Medicine

## 2018-02-17 DIAGNOSIS — G44031 Episodic paroxysmal hemicrania, intractable: Secondary | ICD-10-CM

## 2018-03-14 ENCOUNTER — Ambulatory Visit: Payer: BLUE CROSS/BLUE SHIELD

## 2018-12-05 ENCOUNTER — Ambulatory Visit: Payer: Self-pay | Admitting: Advanced Practice Midwife

## 2018-12-05 ENCOUNTER — Other Ambulatory Visit: Payer: Self-pay

## 2018-12-05 ENCOUNTER — Encounter: Payer: Self-pay | Admitting: Advanced Practice Midwife

## 2018-12-05 DIAGNOSIS — E039 Hypothyroidism, unspecified: Secondary | ICD-10-CM

## 2018-12-05 DIAGNOSIS — Z113 Encounter for screening for infections with a predominantly sexual mode of transmission: Secondary | ICD-10-CM

## 2018-12-05 DIAGNOSIS — G4700 Insomnia, unspecified: Secondary | ICD-10-CM

## 2018-12-05 DIAGNOSIS — B379 Candidiasis, unspecified: Secondary | ICD-10-CM

## 2018-12-05 DIAGNOSIS — G43009 Migraine without aura, not intractable, without status migrainosus: Secondary | ICD-10-CM

## 2018-12-05 DIAGNOSIS — I82409 Acute embolism and thrombosis of unspecified deep veins of unspecified lower extremity: Secondary | ICD-10-CM | POA: Insufficient documentation

## 2018-12-05 DIAGNOSIS — N809 Endometriosis, unspecified: Secondary | ICD-10-CM

## 2018-12-05 DIAGNOSIS — I824Z9 Acute embolism and thrombosis of unspecified deep veins of unspecified distal lower extremity: Secondary | ICD-10-CM

## 2018-12-05 LAB — WET PREP FOR TRICH, YEAST, CLUE
Clue Cell Exam: POSITIVE — AB
Trichomonas Exam: NEGATIVE

## 2018-12-05 MED ORDER — CLOTRIMAZOLE 1 % VA CREA: 1.0000 | TOPICAL_CREAM | Freq: Every day | VAGINAL | 0 refills | Status: AC

## 2018-12-05 NOTE — Addendum Note (Signed)
Addended by: Hal Morales A on: 12/05/2018 05:23 PM   Modules accepted: Orders

## 2018-12-05 NOTE — Progress Notes (Signed)
    STI clinic/screening visit  Subjective:  Kelly Colon is a 43 y.o. nullip WF female being seen today for an STI screening visit. The patient reports they do have symptoms.  Patient has the following medical conditions:   Patient Active Problem List   Diagnosis Date Noted  . DVT (deep venous thrombosis) ([place]) 12/05/2018  . Migraine without aura 12/22/2013  . Anxiety 12/22/2013  . Insomnia 12/22/2013  . 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 Endometriosis 03/13/2013  . Osteopenia 03/13/2013  . Hypothyroid 03/13/2013     Chief Complaint  Patient presents with  . SEXUALLY TRANSMITTED DISEASE    HPI  Patient reports vaginal dryness and external vaginal itching x 3-4 mo, constipation for "years"  See flowsheet for further details and programmatic requirements.    The following portions of the patient's history were reviewed and updated as appropriate: allergies, current medications, past medical history, past social history, past surgical history and problem list.  Objective:  There were no vitals filed for this visit.  Physical Exam Constitutional:      Appearance: Normal appearance. She is normal weight.  HENT:     Head: Normocephalic and atraumatic.     Nose: Nose normal.     Mouth/Throat:     Mouth: Mucous membranes are moist.  Eyes:     Conjunctiva/sclera: Conjunctivae normal.  Neck:     Musculoskeletal: Neck supple.  Pulmonary:     Effort: Pulmonary effort is normal.     Breath sounds: Normal breath sounds.  Abdominal:     Palpations: Abdomen is soft.     Tenderness: There is no abdominal tenderness.     Hernia: There is no hernia in the left inguinal area or right inguinal area.     Comments: Good tone, soft without tenderness  Genitourinary:    General: Normal vulva.     Exam position: Lithotomy position.     Labia:        Right: No rash or lesion.        Left: No rash or lesion.      Vagina: Vaginal discharge (white thick clumpy, ph<4.5, cx and uterus,  ovaries removed surgically) present.     Rectum: Normal.  Lymphadenopathy:     Lower Body: No right inguinal adenopathy. No left inguinal adenopathy.  Skin:    General: Skin is warm and dry.  Neurological:     Mental Status: She is alert.  Psychiatric:        Mood and Affect: Mood normal.        Behavior: Behavior normal.       Assessment and Plan:  Kelly Colon is a 43 y.o. female presenting to the [place] for STI screening with vaginal dryness and external vaginal itching x 3-4 mo.  Recent use of Cipro for possible UTI (finished taking 11/23/18).  Hx endometriosis, hysterectomy with ovaries removed, DVT, migraines, osteoporosis, thyroid removed 2016.  Has appt this afternoon with her GYN   1. Screening examination for venereal disease Treat wet mount per standing orders Immunization nurse consult - WET PREP FOR [place], YEAST, CLUE - Syphilis Serology, [place] [facility] - HIV [place] [facility] - [place] [facility]     Return if symptoms worsen or fail to improve.  No future appointments.  Herbie Saxon, CNM

## 2018-12-05 NOTE — Progress Notes (Addendum)
Here today for STD screening. Accepts bloodwork. Complains of vaginal dryness. Hal Morales, RN Wet Prep results reviewed. Patient treated for yeast per standing orders. Hal Morales, RN

## 2019-01-16 ENCOUNTER — Ambulatory Visit: Payer: Self-pay | Admitting: Urology

## 2019-01-20 ENCOUNTER — Ambulatory Visit: Payer: Self-pay | Admitting: Urology

## 2019-06-12 ENCOUNTER — Encounter: Payer: Self-pay | Admitting: Certified Nurse Midwife

## 2019-10-30 ENCOUNTER — Other Ambulatory Visit: Payer: Self-pay | Admitting: Internal Medicine

## 2019-10-30 DIAGNOSIS — Z1231 Encounter for screening mammogram for malignant neoplasm of breast: Secondary | ICD-10-CM

## 2019-11-23 ENCOUNTER — Ambulatory Visit
Admission: RE | Admit: 2019-11-23 | Discharge: 2019-11-23 | Disposition: A | Discharge status: Home / Self Care | Payer: 59 | Source: Ambulatory Visit | Attending: Internal Medicine | Admitting: Internal Medicine

## 2019-11-23 ENCOUNTER — Other Ambulatory Visit: Payer: Self-pay

## 2019-11-23 DIAGNOSIS — Z1231 Encounter for screening mammogram for malignant neoplasm of breast: Secondary | ICD-10-CM | POA: Diagnosis present

## 2019-12-07 ENCOUNTER — Other Ambulatory Visit: Payer: Self-pay | Admitting: Internal Medicine

## 2019-12-07 DIAGNOSIS — R928 Other abnormal and inconclusive findings on diagnostic imaging of breast: Secondary | ICD-10-CM

## 2019-12-07 DIAGNOSIS — N6310 Unspecified lump in the right breast, unspecified quadrant: Secondary | ICD-10-CM

## 2019-12-11 ENCOUNTER — Ambulatory Visit
Admission: RE | Admit: 2019-12-11 | Discharge: 2019-12-11 | Disposition: A | Discharge status: Home / Self Care | Payer: 59 | Source: Ambulatory Visit | Attending: Internal Medicine | Admitting: Internal Medicine

## 2019-12-11 ENCOUNTER — Other Ambulatory Visit: Payer: Self-pay

## 2019-12-11 DIAGNOSIS — N6310 Unspecified lump in the right breast, unspecified quadrant: Secondary | ICD-10-CM

## 2019-12-11 DIAGNOSIS — R928 Other abnormal and inconclusive findings on diagnostic imaging of breast: Secondary | ICD-10-CM

## 2019-12-18 ENCOUNTER — Other Ambulatory Visit: Payer: Self-pay | Admitting: Internal Medicine

## 2019-12-18 DIAGNOSIS — R928 Other abnormal and inconclusive findings on diagnostic imaging of breast: Secondary | ICD-10-CM

## 2019-12-18 DIAGNOSIS — N6310 Unspecified lump in the right breast, unspecified quadrant: Secondary | ICD-10-CM

## 2019-12-25 ENCOUNTER — Other Ambulatory Visit: Payer: Self-pay

## 2019-12-25 ENCOUNTER — Ambulatory Visit
Admission: RE | Admit: 2019-12-25 | Discharge: 2019-12-25 | Disposition: A | Discharge status: Home / Self Care | Payer: 59 | Source: Ambulatory Visit | Attending: Internal Medicine | Admitting: Internal Medicine

## 2019-12-25 DIAGNOSIS — N6310 Unspecified lump in the right breast, unspecified quadrant: Secondary | ICD-10-CM

## 2019-12-25 DIAGNOSIS — R928 Other abnormal and inconclusive findings on diagnostic imaging of breast: Secondary | ICD-10-CM | POA: Insufficient documentation

## 2019-12-25 HISTORY — PX: BREAST BIOPSY: SHX20

## 2019-12-28 LAB — SURGICAL PATHOLOGY

## 2020-02-26 ENCOUNTER — Other Ambulatory Visit: Payer: Self-pay

## 2020-02-26 ENCOUNTER — Ambulatory Visit (LOCAL_COMMUNITY_HEALTH_CENTER): Payer: 59

## 2020-02-26 ENCOUNTER — Ambulatory Visit: Payer: Self-pay | Admitting: Family Medicine

## 2020-02-26 ENCOUNTER — Encounter: Payer: Self-pay | Admitting: Family Medicine

## 2020-02-26 DIAGNOSIS — Z23 Encounter for immunization: Secondary | ICD-10-CM

## 2020-02-26 DIAGNOSIS — Z113 Encounter for screening for infections with a predominantly sexual mode of transmission: Secondary | ICD-10-CM

## 2020-02-26 LAB — WET PREP FOR TRICH, YEAST, CLUE
Trichomonas Exam: NEGATIVE
Yeast Exam: NEGATIVE

## 2020-02-26 NOTE — Progress Notes (Signed)
[facility] Department STI clinic/screening visit  Subjective:  Kelly Colon is a 44 y.o. female being seen today for No chief complaint on file.    The patient reports they do have symptoms. Patient reports that they do not desire a pregnancy in the next year. They reported they are not interested in discussing contraception today.   Patient has the following medical conditions:   Patient Active Problem List   Diagnosis Date Noted  . DVT (deep venous thrombosis) (HCC) 12/05/2018  . History of DVT (deep vein thrombosis) 02/14/2018  . Surgical menopause 10/19/2015  . Hyperlipemia, mixed 08/06/2014  . Migraine without aura 12/22/2013  . Anxiety 12/22/2013  . Insomnia 12/22/2013  . 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 Osteopenia 03/13/2013  . Acquired hypothyroidism 03/13/2013  . Crohn's disease (HCC) 12/07/2011  . Endometriosis 10/05/2011    HPI  Pt reports she is here for STI screening. She was diagnosed with yeast infection 2 days ago d/t cottage cheese-like discharge and itching. She was prescribed fluconazole 100mg  1 tablet by mouth for 5 days, is on day 2 with symptoms already improved. She is not getting screening due to these symptoms, just to be sure nothing else is going on.   See flowsheet for further details and programmatic requirements.    Patient's last menstrual period was 05/29/2013. Last sex: Oct 15 BCM: total hysterectomy per pt Desires EC? n/a  Last pap per pt/review of record: 2016, has appt with ob/gyn for this in Jan Last HIV test per pt/review of record: 11/2018  Last tetanus vaccine: 07/2008 Flu vaccine: has had   No components found for: HCV  The following portions of the patient's history were reviewed and updated as appropriate: allergies, current medications, past medical history, past social history, past surgical history and problem list.  Objective:  There were no vitals filed for this visit.   Physical Exam Vitals and nursing  note reviewed.  Constitutional:      Appearance: Normal appearance.  HENT:     Head: Normocephalic and atraumatic.     Mouth/Throat:     Mouth: Mucous membranes are moist.     Pharynx: Oropharynx is clear. No oropharyngeal exudate or posterior oropharyngeal erythema.  Pulmonary:     Effort: Pulmonary effort is normal.  Abdominal:     General: Abdomen is flat.     Palpations: There is no mass.     Tenderness: There is no abdominal tenderness. There is no rebound.  Genitourinary:    General: Normal vulva.     Exam position: Lithotomy position.     Pubic Area: No rash or pubic lice.      Labia:        Right: No rash or lesion.        Left: No rash or lesion.      Vagina: Vaginal discharge (scant, white, ph>4.5) present. No erythema, bleeding or lesions.     Uterus: Absent.      Adnexa: Right adnexa normal and left adnexa normal.     Rectum: Normal.     Comments: Cervix, ovaries and uterus surgically absent Lymphadenopathy:     Head:     Right side of head: No preauricular or posterior auricular adenopathy.     Left side of head: No preauricular or posterior auricular adenopathy.     Cervical: No cervical adenopathy.     Upper Body:     Right upper body: No supraclavicular or axillary adenopathy.     Left upper  body: No supraclavicular or axillary adenopathy.     Lower Body: No right inguinal adenopathy. No left inguinal adenopathy.  Skin:    General: Skin is warm and dry.     Findings: No rash.  Neurological:     Mental Status: She is alert and oriented to person, place, and time.      Assessment and Plan:  Kelly Colon is a 44 y.o. female presenting to the [facility] Department for STI screening    1. Screening examination for venereal disease -Pt with symptoms, currently being treated for yeast infection. Screenings today as below. Treat wet prep per standing order if anything found aside from yeast -Patient does not meet criteria for HepB, HepC  Screening.  -Counseled on warning s/sx and when to seek care. Recommended condom use with all sex and discussed importance of condom use for STI prevention. - WET PREP FOR [place], YEAST, CLUE - Chlamydia/Gonorrhea [place] [facility] - HIV [place] [facility] - Syphilis Serology,  Lab  Pt would like tetanus vaccine. RN notified.  Return for screening as needed.  Future Appointments  Date Time Provider Department Center  04/01/2020  2:30 PM [name], MD [facility] None    Ann Held, PA-C

## 2020-02-26 NOTE — Progress Notes (Signed)
Pt received Tdap per pt request and pt tolerated well.

## 2020-02-26 NOTE — Progress Notes (Addendum)
Wet mount reviewed with provider and is negative today, so no treatment needed for wet mount per standing order and provider verbal order. Counseled pt per provider orders and pt states understanding. Pt received Tdap today per pt request- please see immunization encounter/documentation today for details. Provider orders completed.

## 2020-03-29 ENCOUNTER — Other Ambulatory Visit: Payer: Self-pay

## 2020-03-29 DIAGNOSIS — N390 Urinary tract infection, site not specified: Secondary | ICD-10-CM

## 2020-04-01 ENCOUNTER — Other Ambulatory Visit: Payer: Self-pay

## 2020-04-01 ENCOUNTER — Encounter: Payer: Self-pay | Admitting: Urology

## 2020-04-01 ENCOUNTER — Ambulatory Visit: Payer: 59 | Admitting: Urology

## 2020-04-01 ENCOUNTER — Ambulatory Visit (INDEPENDENT_AMBULATORY_CARE_PROVIDER_SITE_OTHER): Payer: 59 | Admitting: Urology

## 2020-04-01 ENCOUNTER — Other Ambulatory Visit
Admission: RE | Admit: 2020-04-01 | Discharge: 2020-04-01 | Disposition: A | Discharge status: Home / Self Care | Payer: 59 | Attending: Urology | Admitting: Urology

## 2020-04-01 VITALS — BP 113/74 | HR 65 | Ht 61.0 in | Wt 120.0 lb

## 2020-04-01 DIAGNOSIS — N3010 Interstitial cystitis (chronic) without hematuria: Secondary | ICD-10-CM

## 2020-04-01 DIAGNOSIS — K5909 Other constipation: Secondary | ICD-10-CM | POA: Diagnosis not present

## 2020-04-01 DIAGNOSIS — N390 Urinary tract infection, site not specified: Secondary | ICD-10-CM | POA: Diagnosis present

## 2020-04-01 LAB — URINALYSIS, COMPLETE (UACMP) WITH MICROSCOPIC
Bacteria, UA: NONE SEEN
Bilirubin Urine: NEGATIVE
Glucose, UA: NEGATIVE mg/dL
Hgb urine dipstick: NEGATIVE
Ketones, ur: NEGATIVE mg/dL
Leukocytes,Ua: NEGATIVE
Nitrite: NEGATIVE
Protein, ur: NEGATIVE mg/dL
Specific Gravity, Urine: 1.02 (ref 1.005–1.030)
WBC, UA: NONE SEEN WBC/hpf (ref 0–5)
pH: 5.5 (ref 5.0–8.0)

## 2020-04-01 MED ORDER — URIBEL 118 MG PO CAPS: 1.0000 | ORAL_CAPSULE | Freq: Four times a day (QID) | ORAL | 1 refills | Status: DC | PRN

## 2020-04-01 NOTE — Patient Instructions (Signed)
IC/BPS Flare Fact Sheet  Interstitial cystitis patients often struggle with "flares," a sudden and dramatic worsening of their bladder symptoms. Lasting from hours to weeks, flares can be unpredictable, disruptive and difficult to manage for newly diagnosed and veteran IC patients. Bladder wall irritation is the most common type of flare, often occurring after patients eat acidic foods, are under high stress or struggling with hormone changes. Pelvic floor muscle tension can also drive flares, particularly when those muscles are provoked through riding in a car or sexual intimacy. The good news is that flares are generally short term. They do, however, require prompt intervention to reduce/avoid the trigger and minimize the duration of the flare.  Typical Flare Triggers  DIET - 95% of patients report that certain foods exacerbate their IC symptoms. The most common offenders are foods high in acid, caffeine and alcohol.  DRIVING - 63% of IC patients report pain and discomfort while riding in a car, train, airplane or on a motorcycle. Patients are encouraged to avoid long rides until their condition has improved.  STRESS & ANXIETY - High periods of physical or emotional stress can exacerbate symptoms, including periods of intense cold, heat and emotional distress.  SEX & INTIMACY - Men with IC may experience pain at the moment of orgasm while women often feel their worst 24-48 hours after intercourse, the result of pelvic floor muscle spasms.  EXERCISE - Exercise that jars or puts pressure on the pelvic floor (i.e. riding a bicycle or motorcycle, spinning, running or stairs) can exacerbate pelvic pain and bladder symptoms. Exercises that keep the hips level are ideal, such as: walking, elliptical, rowing, gentle yoga and/or pilates.  HORMONES - Many women struggle with short term flares during ovulation and a few days before their period. Ironically, some patients flare when  their progesterone levels are higher, while others flare when their estrogen levels are higher. Post-menopausal women may experience more bladder, urethral and vulvar sensitivity due to dryness of the skin. Using a compounded, preservative-free estrogen cream may help patients with estrogen atrophy.  CHEMICAL EXPOSURE - Chemical exposures can trigger an IC flare, including: scented candles, room fresheners, cleansers, paints, solvents and pest control products. Patients with sensitive skin report that scented laundry detergent, fabric softeners and/or dryer sheets can trigger urethral, vulvar or rectal discomfort.  The ICN Flare [facility] offers more detailed information on flares and hour by hour rescue plans that can help reduce discomfort.   [email or web address]

## 2020-04-01 NOTE — Progress Notes (Signed)
04/01/2020 3:10 PM   Kelly Colon 12-23-1975 [id number]  Referring provider: [name], MD [phone] [facility] [address] [facility] [place],  [place] [address]  Chief Complaint  Patient presents with  . New Patient (Initial Visit)    HPI: 45 year old female who presents today for further evaluation of possible recurrent UTI/bladder pain.  She reports that over the past several years, she has had infrequent urinary tract infections.  She notes that she having episodes of dysuria which she relates to UTIs.  She had 1 such episode in November.  At this time, she felt that it was a UTI however her urinalysis was completely negative.  He notes an increased amount of stress during this time.  She also reports that she occasionally has some bladder sensitivity and discomfort.  This is particularly when her bladder is full.  She has been monitoring her behavior and diet and it seems to be dietary related.  She reports that spicy foods, coffee, and other bladder irritants tend to make her symptoms worse.  She also has chronic constipation.  She previously had a history of loose stool and has been followed intermittently by gastroenterologist.  She has not seen him in a couple years.  She has a personal history of endometriosis.  She one of her ovaries removed as a young adult and more recently, had a hysterectomy and her last ovary removed in 2017.  This is complicated by a DVT.  She is currently on hormone replacement therapy managed by her GYN provider.  She is sexually active.  She reports that she has had more recent episodes of pain with intercourse.  She has no other vaginal symptoms including no vaginal discharge, bulging, or any other symptoms.  No gross hematuria.  She is asymptomatic today.   PMH: Past Medical History:  Diagnosis Date  . Crohn's disease (HCC)   . DVT (deep venous thrombosis) (HCC)   . Endometriosis   . Gastric ulcer   . Headache    Migraine   . Hyperlipidemia   . Hypothyroidism   . Osteopenia   . Osteoporosis     Surgical History: Past Surgical History:  Procedure Laterality Date  . ABDOMINAL HYSTERECTOMY  2017  . APPENDECTOMY    . BREAST CYST EXCISION Right    when she was 18  . COLONOSCOPY WITH PROPOFOL N/A 01/14/2015   Procedure: COLONOSCOPY WITH PROPOFOL;  Surgeon: Scot Jun, MD;  Location: [facility];  Service: Endoscopy;  Laterality: N/A;  . LAPAROSCOPY     x 4, for treatment of endometriosis  . OOPHORECTOMY Left   . PELVIC LAPAROSCOPY     2005, 2007, 2010, 2013  . THYROIDECTOMY      Home Medications:  Allergies as of 04/01/2020      Reactions   Ciprofloxacin Other (See Comments)   GI upset   Hydrocodone-acetaminophen Hives   Vicodin [hydrocodone-acetaminophen]       Medication List       Accurate as of April 01, 2020  3:10 PM. If you have any questions, ask your nurse or doctor.        Azelastine HCl 0.15 % Soln Place 2 sprays into the nose 2 (two) times daily as needed.   calcium-vitamin D 250-100 MG-UNIT tablet Take 2 tablets by mouth 2 (two) times daily.   cyclobenzaprine 10 MG tablet Commonly known as: FLEXERIL TAKE 1 TABLET BY MOUTH AT BEDTIME FOR MUSCLES   docusate sodium 100 MG capsule Commonly known  as: COLACE Take by mouth.   erythromycin with ethanol 2 % external solution Commonly known as: THERAMYCIN APPLY TO FACE DAILY AS NEEDED   estradiol 0.1 MG/24HR patch Commonly known as: VIVELLE-DOT Place onto the skin.   levothyroxine 75 MCG tablet Commonly known as: SYNTHROID Take 75 mcg by mouth daily before breakfast.   magnesium oxide 400 MG tablet Commonly known as: MAG-OX Take by mouth.   Multi-Vitamin tablet Take 1 tablet by mouth daily.   rizatriptan 10 MG tablet Commonly known as: Maxalt Take 1 tablet (10 mg total) by mouth as needed for migraine. May repeat in 2 hours if needed   topiramate 25 MG tablet Commonly known as: TOPAMAX Take by mouth.    triamcinolone 0.1 % Commonly known as: KENALOG Apply topically 2 (two) times daily as needed.   Uribel 118 MG Caps Take 1 capsule (118 mg total) by mouth 4 (four) times daily as needed (dysuria). Started by: [name], MD   Vitamin E 33825 UNIT/60GM Crea Apply 1 Units topically daily.   Yuvafem 10 MCG Tabs vaginal tablet Generic drug: Estradiol Place vaginally.   zolpidem 12.5 MG CR tablet Commonly known as: Ambien CR Take 1 tablet (12.5 mg total) by mouth at bedtime as needed for sleep.       Allergies:  Allergies  Allergen Reactions  . Ciprofloxacin Other (See Comments)    GI upset  . Hydrocodone-Acetaminophen Hives  . Vicodin [Hydrocodone-Acetaminophen]     Family History: Family History  Problem Relation Age of Onset  . Endometriosis Mother   . Stroke Mother   . Heart attack Father   . Heart attack Paternal Grandmother   . Cancer Paternal Grandfather        throat, stomach  . Breast cancer Neg Hx   . Prostate cancer Neg Hx   . Bladder Cancer Neg Hx   . Kidney cancer Neg Hx     Social History:  reports that she has never smoked. She has never used smokeless tobacco. She reports that she does not drink alcohol and does not use drugs.   Physical Exam: BP 113/74   Pulse 65   Ht 5\' 1"  (1.549 m)   Wt 120 lb (54.4 kg)   LMP 05/29/2013   BMI 22.67 kg/m   Constitutional:  Alert and oriented, No acute distress. HEENT: [place] AT, moist mucus membranes.  Trachea midline, no masses. Cardiovascular: No clubbing, cyanosis, or edema. Respiratory: Normal respiratory effort, no increased work of breathing. Skin: No rashes, bruises or suspicious lesions. Neurologic: Grossly intact, no focal deficits, moving all 4 extremities. Psychiatric: Normal mood and affect.  Assessment & Plan:    1. Interstitial cystitis (chronic) without hematuria Based on her negative urinalysis at the time of symptoms, highly suspect she has interstitial cystitis rather than true  infection  Have asked her to come for same day or next visit on time that she has the symptoms to help rule this out as a diagnosis (recurrent UTIs)  Overall, her symptoms seem to be managed with dietary control.  Today she is asymptomatic.  We discussed the diease pathophysiology is poorly understood therefore treatment has been focused primarily on symptomatic relief as well as dietary and behavioral modification. Information was given today discussing the current understanding of the syndrome as well as treatment options. IC dietary information also given today as many patients experience relief with simple lifestyle modifications. We also discussed intermittent use of over the counter pyridium (no greater than 3 days at  at time) for intermittent relief.    I have also prescribed her Uribel which could be used on a longer-term basis to have a case she has a flare for symptomatic relief.  If conservative management fails, will consider further work up with cystoscopy to access for Hunter's ulcers or other more aggressive treatments, however, response to each of these interventions is highly variable.    2. Chronic constipation We discussed the bowel and bladder syndrome.  Strongly urged her to work on her constipation with a goal of daily bowel movement per day.  She will follow-up with her gastroenterologist.  Discussed daily Colace along with dietary changes.   Hollice Espy, MD  [facility] [address], [place], [place] [address] [phone]  I spent 45 total minutes on the day of the encounter including pre-visit review of the medical record, face-to-face time with the patient, and post visit ordering of labs/imaging/tests.

## 2020-06-23 NOTE — Progress Notes (Deleted)
06/24/2020 10:23 AM   Kelly Colon December 14, 1975 [id number]  Referring provider: [name], MD [phone] [facility] [address] [facility] [place],  [place] [address]  No chief complaint on file.  Urological history: 1. IC -several episodes of UTI symptoms without positive UA's or urine cultures    HPI: Kelly Colon is a 45 y.o. female who was last seen by Dr. Apolinar Junes in January 2022 for evaluation for possible IC, but she was asymptomatic at that visit.  She was asked to return when she was having symptoms, so that we can obtain an UA and urine culture.      PMH: Past Medical History:  Diagnosis Date  . Crohn's disease (HCC)   . DVT (deep venous thrombosis) (HCC)   . Endometriosis   . Gastric ulcer   . Headache    Migraine  . Hyperlipidemia   . Hypothyroidism   . Osteopenia   . Osteoporosis     Surgical History: Past Surgical History:  Procedure Laterality Date  . ABDOMINAL HYSTERECTOMY  2017  . APPENDECTOMY    . BREAST CYST EXCISION Right    when she was 18  . COLONOSCOPY WITH PROPOFOL N/A 01/14/2015   Procedure: COLONOSCOPY WITH PROPOFOL;  Surgeon: Scot Jun, MD;  Location: [facility] END[facility];  Service: Endoscopy;  Laterality: N/A;  . LAPAROSCOPY     x 4, for treatment of endometriosis  . OOPHORECTOMY Left   . PELVIC LAPAROSCOPY     2005, 2007, 2010, 2013  . THYROIDECTOMY      Home Medications:  Allergies as of 06/24/2020      Reactions   Ciprofloxacin Other (See Comments)   GI upset   Hydrocodone-acetaminophen Hives   Vicodin [hydrocodone-acetaminophen]       Medication List       Accurate as of June 23, 2020 10:23 AM. If you have any questions, ask your nurse or doctor.        Azelastine HCl 0.15 % Soln Place 2 sprays into the nose 2 (two) times daily as needed.   calcium-vitamin D 250-100 MG-UNIT tablet Take 2 tablets by mouth 2 (two) times daily.   cyclobenzaprine 10 MG tablet Commonly known as: FLEXERIL TAKE 1  TABLET BY MOUTH AT BEDTIME FOR MUSCLES   docusate sodium 100 MG capsule Commonly known as: COLACE Take by mouth.   erythromycin with ethanol 2 % external solution Commonly known as: THERAMYCIN APPLY TO FACE DAILY AS NEEDED   estradiol 0.1 MG/24HR patch Commonly known as: VIVELLE-DOT Place onto the skin.   levothyroxine 75 MCG tablet Commonly known as: SYNTHROID Take 75 mcg by mouth daily before breakfast.   magnesium oxide 400 MG tablet Commonly known as: MAG-OX Take by mouth.   Multi-Vitamin tablet Take 1 tablet by mouth daily.   rizatriptan 10 MG tablet Commonly known as: Maxalt Take 1 tablet (10 mg total) by mouth as needed for migraine. May repeat in 2 hours if needed   topiramate 25 MG tablet Commonly known as: TOPAMAX Take by mouth.   triamcinolone 0.1 % Commonly known as: KENALOG Apply topically 2 (two) times daily as needed.   Uribel 118 MG Caps Take 1 capsule (118 mg total) by mouth 4 (four) times daily as needed (dysuria).   Vitamin E 67209 UNIT/60GM Crea Apply 1 Units topically daily.   Yuvafem 10 MCG Tabs vaginal tablet Generic drug: Estradiol Place vaginally.   zolpidem 12.5 MG CR tablet Commonly known as: Ambien CR Take 1  tablet (12.5 mg total) by mouth at bedtime as needed for sleep.       Allergies:  Allergies  Allergen Reactions  . Ciprofloxacin Other (See Comments)    GI upset  . Hydrocodone-Acetaminophen Hives  . Vicodin [Hydrocodone-Acetaminophen]     Family History: Family History  Problem Relation Age of Onset  . Endometriosis Mother   . Stroke Mother   . Heart attack Father   . Heart attack Paternal Grandmother   . Cancer Paternal Grandfather        throat, stomach  . Breast cancer Neg Hx   . Prostate cancer Neg Hx   . Bladder Cancer Neg Hx   . Kidney cancer Neg Hx     Social History:  reports that she has never smoked. She has never used smokeless tobacco. She reports that she does not drink alcohol and does not use  drugs.  ROS: Pertinent ROS in HPI  Physical Exam: LMP 05/29/2013   Constitutional:  Well nourished. Alert and oriented, No acute distress. HEENT: [place] AT, moist mucus membranes.  Trachea midline, no masses. Cardiovascular: No clubbing, cyanosis, or edema. Respiratory: Normal respiratory effort, no increased work of breathing. GI: Abdomen is soft, non tender, non distended, no abdominal masses. Liver and spleen not palpable.  No hernias appreciated.  Stool sample for occult testing is not indicated.   GU: No CVA tenderness.  No bladder fullness or masses.  *** external genitalia, *** pubic hair distribution, no lesions.  Normal urethral meatus, no lesions, no prolapse, no discharge.   No urethral masses, tenderness and/or tenderness. No bladder fullness, tenderness or masses. *** vagina mucosa, *** estrogen effect, no discharge, no lesions, *** pelvic support, *** cystocele and *** rectocele noted.  No cervical motion tenderness.  Uterus is freely mobile and non-fixed.  No adnexal/parametria masses or tenderness noted.  Anus and perineum are without rashes or lesions.   ***  Skin: No rashes, bruises or suspicious lesions. Lymph: No cervical or inguinal adenopathy. Neurologic: Grossly intact, no focal deficits, moving all 4 extremities. Psychiatric: Normal mood and affect.   Laboratory Data: Urinalysis *** I have reviewed the labs.   Pertinent Imaging: @CT @ @ultrasound @ @KUB @ I have independently reviewed the films.    Assessment & Plan:  ***  1. Dysuria ***  No follow-ups on file.  These notes generated with voice recognition software. I apologize for typographical errors.  , PA-C  [facility] [address]  [address] [place], [facility] [address] [phone]

## 2020-06-24 ENCOUNTER — Encounter: Payer: Self-pay | Admitting: Physician Assistant

## 2020-06-24 ENCOUNTER — Ambulatory Visit: Payer: 59 | Admitting: Urology

## 2020-06-24 ENCOUNTER — Ambulatory Visit (INDEPENDENT_AMBULATORY_CARE_PROVIDER_SITE_OTHER): Payer: 59 | Admitting: Physician Assistant

## 2020-06-24 ENCOUNTER — Other Ambulatory Visit: Payer: Self-pay

## 2020-06-24 VITALS — BP 115/77 | HR 61 | Ht 62.0 in | Wt 122.0 lb

## 2020-06-24 DIAGNOSIS — N3010 Interstitial cystitis (chronic) without hematuria: Secondary | ICD-10-CM

## 2020-06-24 LAB — MICROSCOPIC EXAMINATION: RBC, Urine: NONE SEEN /hpf (ref 0–2)

## 2020-06-24 LAB — URINALYSIS, COMPLETE
Bilirubin, UA: NEGATIVE
Glucose, UA: NEGATIVE
Ketones, UA: NEGATIVE
Leukocytes,UA: NEGATIVE
Nitrite, UA: NEGATIVE
Protein,UA: NEGATIVE
RBC, UA: NEGATIVE
Specific Gravity, UA: 1.015 (ref 1.005–1.030)
Urobilinogen, Ur: 0.2 mg/dL (ref 0.2–1.0)
pH, UA: 7 (ref 5.0–7.5)

## 2020-06-24 NOTE — Progress Notes (Signed)
06/24/2020 4:00 PM   Kelly Colon 10-30-1975 [id number]  CC: Chief Complaint  Patient presents with  . Cystitis    HPI: Kelly Colon is a 45 y.o. female with probable IC, chronic constipation, and endometriosis s/p hysterectomy and bilateral oophorectomy on hormone replacement therapy who presents today for evaluation of possible UTI.  Today she reports an approximate 2-week history of dysuria, urgency, frequency, and the sensation of bladder fullness.  She is currently taking Diflucan 100 mg daily x5 days for management of a possible episode of vulvovaginal candidiasis.  Overall, she states her urinary symptoms have acutely worsened within the past week.  She has not taken Uribel or Azo for management of her symptoms.  She reports increased stress associated with her symptoms.  She had an episode of dyspareunia earlier this month.  Gastroenterology has started her on Linzess for chronic constipation, but she states it is not working.  She continues HRT with patches and vaginal inserts and reports vaginal dryness despite this.  In-office UA and microscopy today pan negative.  PMH: Past Medical History:  Diagnosis Date  . Crohn's disease (HCC)   . DVT (deep venous thrombosis) (HCC)   . Endometriosis   . Gastric ulcer   . Headache    Migraine  . Hyperlipidemia   . Hypothyroidism   . Osteopenia   . Osteoporosis     Surgical History: Past Surgical History:  Procedure Laterality Date  . ABDOMINAL HYSTERECTOMY  2017  . APPENDECTOMY    . BREAST CYST EXCISION Right    when she was 18  . COLONOSCOPY WITH PROPOFOL N/A 01/14/2015   Procedure: COLONOSCOPY WITH PROPOFOL;  Surgeon: Scot Jun, MD;  Location: [facility];  Service: Endoscopy;  Laterality: N/A;  . LAPAROSCOPY     x 4, for treatment of endometriosis  . OOPHORECTOMY Left   . PELVIC LAPAROSCOPY     2005, 2007, 2010, 2013  . THYROIDECTOMY      Home Medications:  Allergies as of 06/24/2020      Reactions    Ciprofloxacin Other (See Comments)   GI upset   Hydrocodone-acetaminophen Hives   Vicodin [hydrocodone-acetaminophen]       Medication List       Accurate as of June 24, 2020  4:00 PM. If you have any questions, ask your nurse or doctor.        Azelastine HCl 0.15 % Soln Place 2 sprays into the nose 2 (two) times daily as needed.   calcium-vitamin D 250-100 MG-UNIT tablet Take 2 tablets by mouth 2 (two) times daily.   cyclobenzaprine 10 MG tablet Commonly known as: FLEXERIL TAKE 1 TABLET BY MOUTH AT BEDTIME FOR MUSCLES   docusate sodium 100 MG capsule Commonly known as: COLACE Take by mouth.   erythromycin with ethanol 2 % external solution Commonly known as: THERAMYCIN APPLY TO FACE DAILY AS NEEDED   estradiol 0.1 MG/24HR patch Commonly known as: VIVELLE-DOT Place onto the skin.   fluconazole 100 MG tablet Commonly known as: DIFLUCAN Take 100 mg by mouth daily.   levothyroxine 75 MCG tablet Commonly known as: SYNTHROID Take 75 mcg by mouth daily before breakfast.   magnesium oxide 400 MG tablet Commonly known as: MAG-OX Take by mouth.   Multi-Vitamin tablet Take 1 tablet by mouth daily.   rizatriptan 10 MG tablet Commonly known as: Maxalt Take 1 tablet (10 mg total) by mouth as needed for migraine. May repeat in 2 hours if needed   tiZANidine  4 MG tablet Commonly known as: ZANAFLEX Take 4 mg by mouth 3 (three) times daily.   topiramate 25 MG tablet Commonly known as: TOPAMAX Take by mouth.   triamcinolone 0.1 % Commonly known as: KENALOG Apply topically 2 (two) times daily as needed.   Uribel 118 MG Caps Take 1 capsule (118 mg total) by mouth 4 (four) times daily as needed (dysuria).   Vitamin E 06237 UNIT/60GM Crea Apply 1 Units topically daily.   Yuvafem 10 MCG Tabs vaginal tablet Generic drug: Estradiol Place vaginally.   zolpidem 12.5 MG CR tablet Commonly known as: Ambien CR Take 1 tablet (12.5 mg total) by mouth at bedtime as needed  for sleep.       Allergies:  Allergies  Allergen Reactions  . Ciprofloxacin Other (See Comments)    GI upset  . Hydrocodone-Acetaminophen Hives  . Vicodin [Hydrocodone-Acetaminophen]     Family History: Family History  Problem Relation Age of Onset  . Endometriosis Mother   . Stroke Mother   . Heart attack Father   . Heart attack Paternal Grandmother   . Cancer Paternal Grandfather        throat, stomach  . Breast cancer Neg Hx   . Prostate cancer Neg Hx   . Bladder Cancer Neg Hx   . Kidney cancer Neg Hx     Social History:   reports that she has never smoked. She has never used smokeless tobacco. She reports that she does not drink alcohol and does not use drugs.  Physical Exam: BP 115/77   Pulse 61   Ht 5\' 2"  (1.575 m)   Wt 122 lb (55.3 kg)   LMP 05/29/2013   BMI 22.31 kg/m   Constitutional:  Alert and oriented, no acute distress, nontoxic appearing HEENT: [place], AT Cardiovascular: No clubbing, cyanosis, or edema Respiratory: Normal respiratory effort, no increased work of breathing Skin: No rashes, bruises or suspicious lesions Neurologic: Grossly intact, no focal deficits, moving all 4 extremities Psychiatric: Normal mood and affect  Laboratory Data: Results for orders placed or performed in visit on 06/24/20  Microscopic Examination   Urine  Result Value Ref Range   WBC, UA 0-5 0 - 5 /hpf   RBC None seen 0 - 2 /hpf   Epithelial Cells (non renal) 0-10 0 - 10 /hpf   Bacteria, UA Few None seen/Few  Urinalysis, Complete  Result Value Ref Range   Specific Gravity, UA 1.015 1.005 - 1.030   pH, UA 7.0 5.0 - 7.5   Color, UA Yellow Yellow   Appearance Ur Clear Clear   Leukocytes,UA Negative Negative   Protein,UA Negative Negative/Trace   Glucose, UA Negative Negative   Ketones, UA Negative Negative   RBC, UA Negative Negative   Bilirubin, UA Negative Negative   Urobilinogen, Ur 0.2 0.2 - 1.0 mg/dL   Nitrite, UA Negative Negative   Microscopic  Examination See below:    Assessment & Plan:   1. Interstitial cystitis (chronic) without hematuria Worsened dysuria, urgency, frequency associated with increased stress and an episode of dyspareunia.  UA benign today, consistent with IC flare.  We discussed various treatment options at this point including bladder rescue installations versus pelvic floor PT.  Given her status s/p hysterectomy and with painful sex, I recommended pelvic floor PT to treat her pelvic symptoms beyond her bladder.  She is in agreement with this plan.  Referral placed today.  I counseled her to work on stress reduction, avoid known bladder irritants, take 08/24/20  for management of her dysuria, use personal lubricants with sex and nonhormonal vaginal moisturizers on days that she is not using vaginal estrogen, and follow-up if she wishes to pursue bladder rescue. - Urinalysis, Complete - Ambulatory referral to Physical Therapy  Return if symptoms worsen or fail to improve.  Carman Ching, PA-C  [facility] [place] [facility] [address] [place], [place] [address] [phone]

## 2020-06-24 NOTE — Patient Instructions (Signed)
On days that you are not using vaginal estrogen, try the over-the-counter vaginal moisturizer called Replens. When you are having sex, make sure to use personal lubricant to decrease any discomfort.  I've placed a referral for pelvic floor physical therapy today; they will call you to schedule this.  To manage your burning, urgency, and frequency in the meantime:  Avoid bladder irritants including alcohol, caffeine, acidic foods, spicy foods, and chocolate  Manage your stress  Take Uribel, which will turn your urine blue If these aren't effective, contact our office and we can schedule you for bladder rescue treatments 2-3 times weekly for 3 weeks.

## 2020-07-15 ENCOUNTER — Telehealth: Payer: Self-pay | Admitting: Physician Assistant

## 2020-07-15 DIAGNOSIS — N3010 Interstitial cystitis (chronic) without hematuria: Secondary | ICD-10-CM

## 2020-07-15 NOTE — Telephone Encounter (Signed)
Pt asking if she could get referral sent to [facility] in [place]. Please advise.

## 2020-07-15 NOTE — Telephone Encounter (Signed)
Referral sent per patient request to:  [facility] at [facility] [address] [place], [place] [address] Office: [phone]  Fax: [phone]

## 2020-11-11 ENCOUNTER — Other Ambulatory Visit: Payer: Self-pay | Admitting: *Deleted

## 2020-11-11 ENCOUNTER — Ambulatory Visit: Payer: 59 | Admitting: Urology

## 2020-11-11 DIAGNOSIS — N3010 Interstitial cystitis (chronic) without hematuria: Secondary | ICD-10-CM

## 2020-12-01 NOTE — Progress Notes (Signed)
12/02/2020 4:32 PM   Kelly Colon 1975-08-24 [id number]  Referring provider: [name], MD [phone] [facility] [address] [facility] [place],  [place] [address]  Urological history: 1. Interstitial cystitis -contributing factors of IBD -managed with behavioral modification and Uribel   Chief Complaint  Patient presents with   Recurrent UTI    HPI: Kelly Colon is a 45 y.o. female who present for possible UTI.  She had went swimming recently and has noticed after swimming she is started to experience more frequent urination, urgent urination and bladder pain with painful urination.  Patient denies any modifying or aggravating factors.  Patient denies any gross hematuria or suprapubic flank pain.  Patient denies any fevers, chills, nausea or vomiting.    UA benign  PVR 4 mL  PMH: Past Medical History:  Diagnosis Date   Crohn's disease (HCC)    DVT (deep venous thrombosis) (HCC)    Endometriosis    Gastric ulcer    Headache    Migraine   Hyperlipidemia    Hypothyroidism    Osteopenia    Osteoporosis     Surgical History: Past Surgical History:  Procedure Laterality Date   ABDOMINAL HYSTERECTOMY  2017   APPENDECTOMY     BREAST CYST EXCISION Right    when she was 18   COLONOSCOPY WITH PROPOFOL N/A 01/14/2015   Procedure: COLONOSCOPY WITH PROPOFOL;  Surgeon: Scot Jun, MD;  Location: [facility] ENDOSCOPY;  Service: Endoscopy;  Laterality: N/A;   LAPAROSCOPY     x 4, for treatment of endometriosis   OOPHORECTOMY Left    PELVIC LAPAROSCOPY     2005, 2007, 2010, 2013   THYROIDECTOMY      Home Medications:  Allergies as of 12/02/2020       Reactions   Ciprofloxacin Other (See Comments)   GI upset   Hydrocodone-acetaminophen Hives   Vicodin [hydrocodone-acetaminophen]         Medication List        Accurate as of December 02, 2020 11:59 PM. If you have any questions, ask your nurse or doctor.          STOP taking these  medications    rizatriptan 10 MG tablet Commonly known as: Maxalt Stopped by: Elpidio Thielen, PA-C   tiZANidine 4 MG tablet Commonly known as: ZANAFLEX Stopped by: Peta Peachey, PA-C   topiramate 25 MG tablet Commonly known as: TOPAMAX Stopped by: Michiel Cowboy, PA-C       TAKE these medications    Azelastine HCl 0.15 % Soln Place 2 sprays into the nose 2 (two) times daily as needed.   calcium-vitamin D 250-100 MG-UNIT tablet Take 2 tablets by mouth 2 (two) times daily.   cyclobenzaprine 10 MG tablet Commonly known as: FLEXERIL   docusate sodium 100 MG capsule Commonly known as: COLACE Take by mouth.   erythromycin with ethanol 2 % external solution Commonly known as: THERAMYCIN APPLY TO FACE DAILY AS NEEDED   estradiol 0.1 MG/24HR patch Commonly known as: VIVELLE-DOT Place onto the skin.   fluconazole 100 MG tablet Commonly known as: DIFLUCAN Take 100 mg by mouth daily.   levothyroxine 75 MCG tablet Commonly known as: SYNTHROID Take 75 mcg by mouth daily before breakfast.   magnesium oxide 400 MG tablet Commonly known as: MAG-OX Take by mouth.   Multi-Vitamin tablet Take 1 tablet by mouth daily.   triamcinolone cream 0.1 % Commonly known as: KENALOG Apply topically 2 (two) times daily as  needed.   Uribel 118 MG Caps Take 1 capsule (118 mg total) by mouth every 6 (six) hours as needed. What changed:  when to take this reasons to take this Changed by: Elie Leppo, PA-C   Vitamin E 08676 UNIT/60GM Crea Apply 1 Units topically daily.   Yuvafem 10 MCG Tabs vaginal tablet Generic drug: Estradiol Place vaginally.   zolpidem 12.5 MG CR tablet Commonly known as: Ambien CR Take 1 tablet (12.5 mg total) by mouth at bedtime as needed for sleep.        Allergies:  Allergies  Allergen Reactions   Ciprofloxacin Other (See Comments)    GI upset   Hydrocodone-Acetaminophen Hives   Vicodin [Hydrocodone-Acetaminophen]     Family  History: Family History  Problem Relation Age of Onset   Endometriosis Mother    Stroke Mother    Heart attack Father    Heart attack Paternal Grandmother    Cancer Paternal Grandfather        throat, stomach   Breast cancer Neg Hx    Prostate cancer Neg Hx    Bladder Cancer Neg Hx    Kidney cancer Neg Hx     Social History:  reports that she has never smoked. She has never used smokeless tobacco. She reports that she does not drink alcohol and does not use drugs.  ROS: Pertinent ROS in HPI  Physical Exam: BP 110/74   Pulse 64   Ht 5\' 2"  (1.575 m)   Wt 122 lb (55.3 kg)   LMP 05/29/2013   BMI 22.31 kg/m   Constitutional:  Well nourished. Alert and oriented, No acute distress. HEENT: [place] AT, mask in place.  Trachea midline Cardiovascular: No clubbing, cyanosis, or edema. Respiratory: Normal respiratory effort, no increased work of breathing. Neurologic: Grossly intact, no focal deficits, moving all 4 extremities. Psychiatric: Normal mood and affect.    Laboratory Data: Thyroxine, Free (FT4) 0.66 - 1.14 ng/dL 07/29/2013   Resulting Agency  [facility]  Narrative Performed by [facility] This reference range applies to non-pregnant individuals 18 or older.  Contact Laboratory for reference ranges for pregnant patients.   Biotin ingestion may interfere with free T4 and Total T3 tests. If the results are inconsistent with the TSH level, previous test results, or the clinical presentation, then consider biotin interference. If needed, order repeat testing after stopping biotin. Specimen Collected: 06/30/20 07:19 Last Resulted: 06/30/20 09:53  [facility]  Result Received: 07/15/20 09:15    Urinalysis Component     Latest Ref Rng & Units 12/02/2020  Specific Gravity, UA     1.005 - 1.030 1.020  pH, UA     5.0 - 7.5 6.5  Color, UA     Yellow Yellow  Appearance Ur     Clear Hazy (A)  Leukocytes,UA     Negative  Negative  Protein,UA     Negative/Trace Negative  Glucose, UA     Negative Negative  Ketones, UA     Negative Negative  RBC, UA     Negative Negative  Bilirubin, UA     Negative Negative  Urobilinogen, Ur     0.2 - 1.0 mg/dL 0.2  Nitrite, UA     Negative Negative  Microscopic Examination      See below:   Component     Latest Ref Rng & Units 12/02/2020  WBC, UA     0 - 5 /hpf 0-5  RBC  0 - 2 /hpf 0-2  Epithelial Cells (non renal)     0 - 10 /hpf 0-10  Bacteria, UA     None seen/Few Few  I have reviewed the labs.   Pertinent Imaging: Results for TEONA, VARGUS (MRN [id number]) as of 12/02/2020 09:43  Ref. Range 12/02/2020 09:08  Scan Result Unknown 4   Assessment & Plan:    1. Dysuria -UA benign -urine sent for culture to rule out indolent infection -Uribel samples given and script with coupon given to take every 6 hour for dysuria -dicussed the IC diet and tips to avoid acidic food in particular as drinking low acid coffee, eating low acid spaghetti sauce, eating low acid tomato use and using an alkaline powder to known as any foods  2. Dyspareunia -Patient is not currently sexually active, but it has been an issue in the past -I have sent a referral for pelvic floor physical therapy to address this issue  3.  Vaginal atrophy -She is currently on a estrogen patch and a vaginal insert -I given her a sample of Premarin cream to use topically at the anterior portion of the introitus to see if she experiences better symptom relief versus using the insertion  Return for pending urine culture results .  These notes generated with voice recognition software. I apologize for typographical errors.  Michiel Cowboy, PA-C  [facility] [address]  [address] [place], [place] [address] [phone]

## 2020-12-02 ENCOUNTER — Encounter: Payer: Self-pay | Admitting: Urology

## 2020-12-02 ENCOUNTER — Other Ambulatory Visit: Payer: Self-pay

## 2020-12-02 ENCOUNTER — Ambulatory Visit (INDEPENDENT_AMBULATORY_CARE_PROVIDER_SITE_OTHER): Payer: 59 | Admitting: Urology

## 2020-12-02 VITALS — BP 110/74 | HR 64 | Ht 62.0 in | Wt 122.0 lb

## 2020-12-02 DIAGNOSIS — R300 Dysuria: Secondary | ICD-10-CM | POA: Diagnosis not present

## 2020-12-02 DIAGNOSIS — N390 Urinary tract infection, site not specified: Secondary | ICD-10-CM | POA: Diagnosis not present

## 2020-12-02 DIAGNOSIS — N941 Unspecified dyspareunia: Secondary | ICD-10-CM | POA: Diagnosis not present

## 2020-12-02 DIAGNOSIS — N952 Postmenopausal atrophic vaginitis: Secondary | ICD-10-CM | POA: Diagnosis not present

## 2020-12-02 LAB — URINALYSIS, COMPLETE
Bilirubin, UA: NEGATIVE
Glucose, UA: NEGATIVE
Ketones, UA: NEGATIVE
Leukocytes,UA: NEGATIVE
Nitrite, UA: NEGATIVE
Protein,UA: NEGATIVE
RBC, UA: NEGATIVE
Specific Gravity, UA: 1.02 (ref 1.005–1.030)
Urobilinogen, Ur: 0.2 mg/dL (ref 0.2–1.0)
pH, UA: 6.5 (ref 5.0–7.5)

## 2020-12-02 LAB — MICROSCOPIC EXAMINATION

## 2020-12-02 LAB — BLADDER SCAN AMB NON-IMAGING: Scan Result: 4

## 2020-12-02 MED ORDER — URIBEL 118 MG PO CAPS: 1.0000 | ORAL_CAPSULE | Freq: Four times a day (QID) | ORAL | 3 refills | Status: DC | PRN

## 2020-12-07 LAB — CULTURE, URINE COMPREHENSIVE

## 2020-12-15 ENCOUNTER — Telehealth: Payer: Self-pay | Admitting: Urology

## 2020-12-15 NOTE — Telephone Encounter (Signed)
Pt LMOM that she wanted to discuss with someone her last visit w/Shannon.  She also said she is interested in scheduling the procedure her and Carollee Herter talked about.

## 2020-12-15 NOTE — Telephone Encounter (Signed)
.  left message to have patient return my call.  

## 2021-05-09 IMAGING — US US BREAST*R* LIMITED INC AXILLA
1 series · 6 of 6 positions shown · non-contrast
Comparison: Previous exam(s).

CLINICAL DATA: Patient recalled from screening for right breast
mass.

EXAM:
DIGITAL DIAGNOSTIC RIGHT MAMMOGRAM WITH CAD AND TOMO
ULTRASOUND RIGHT BREAST

[Series 1: us breast*right* limited inc axilla · 0.05mm/px · 6 of 6 slices shown]
[im 1/6]
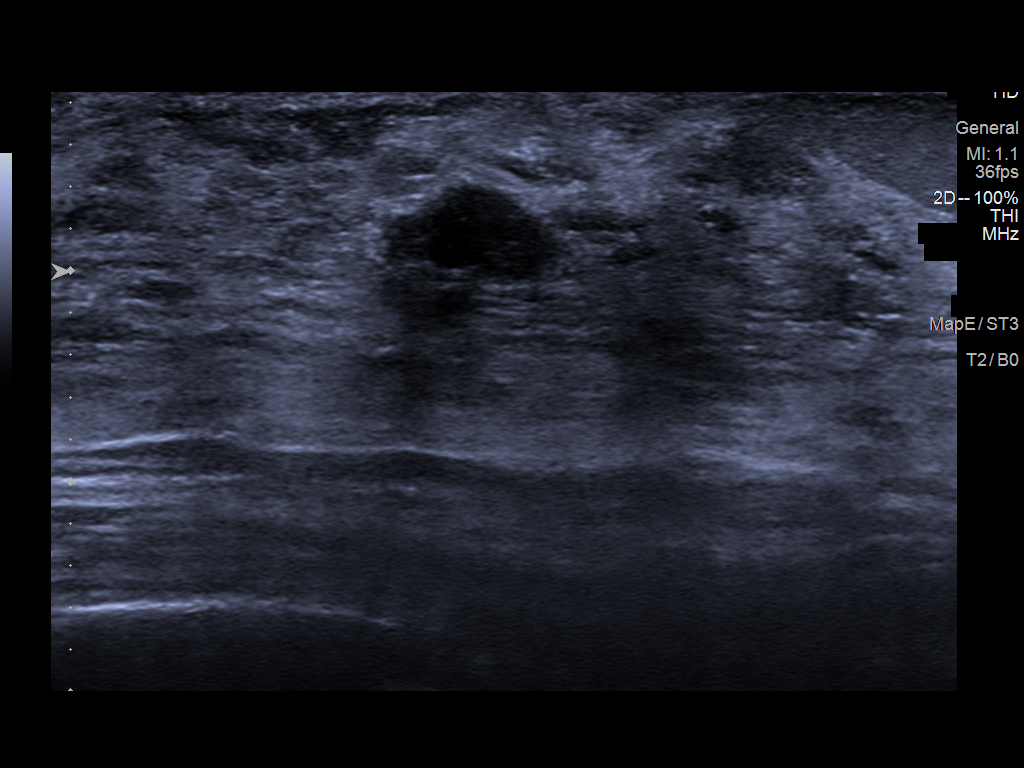
[im 2/6]
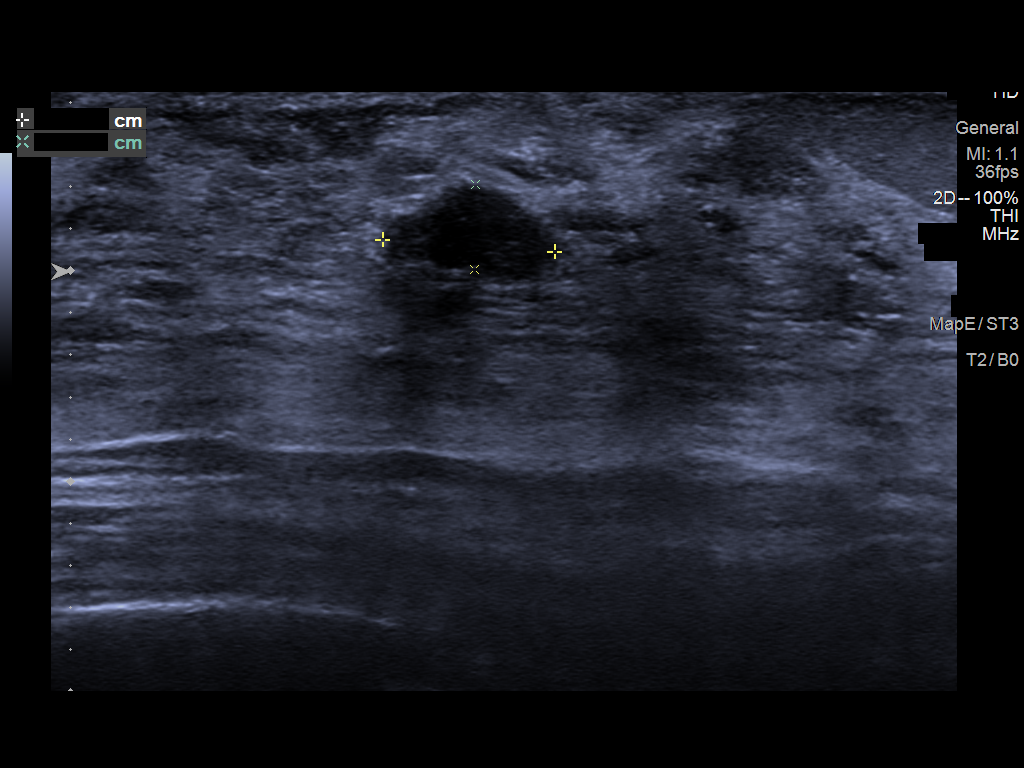
[im 3/6]
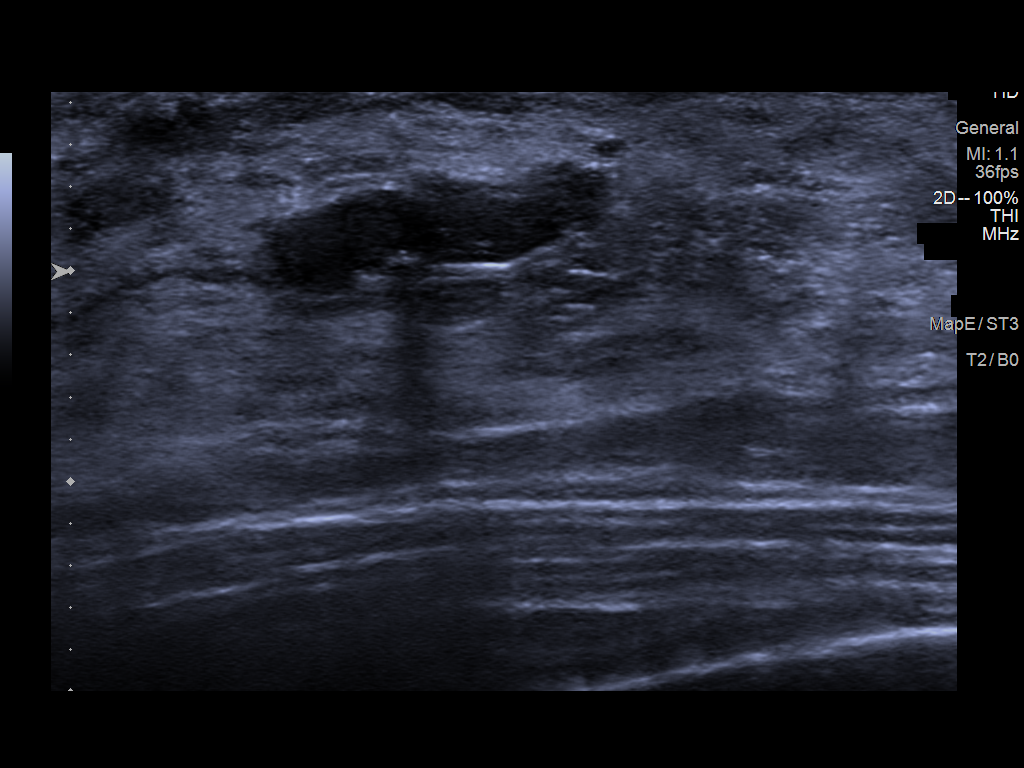
[im 4/6]
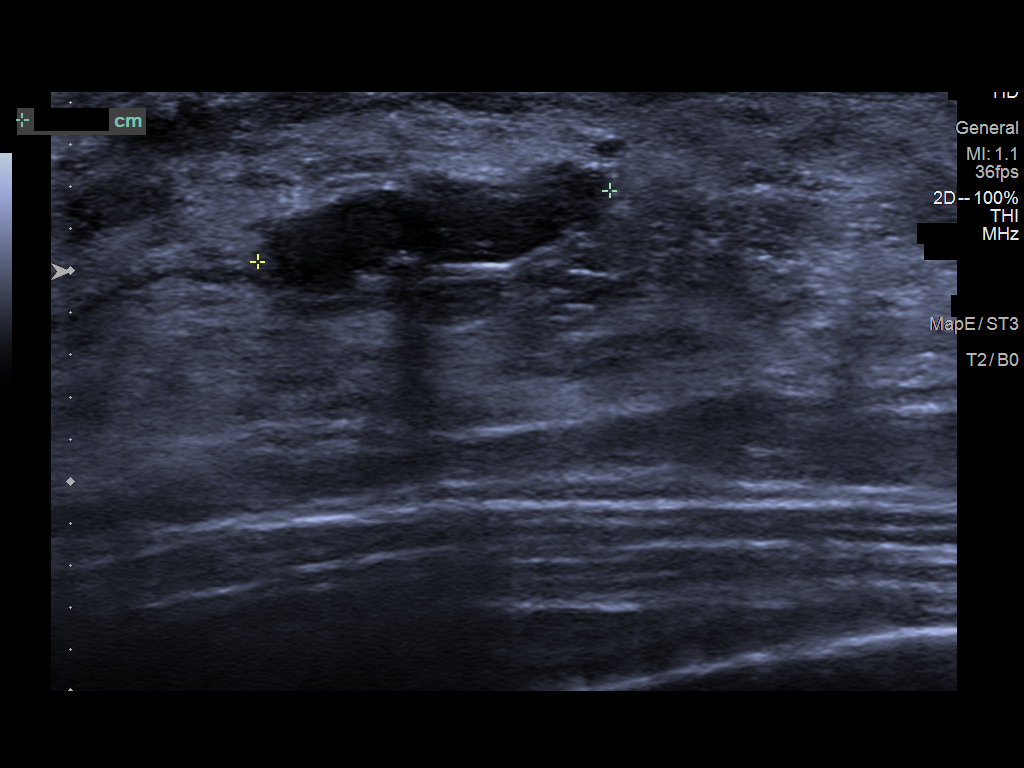
[im 5/6]
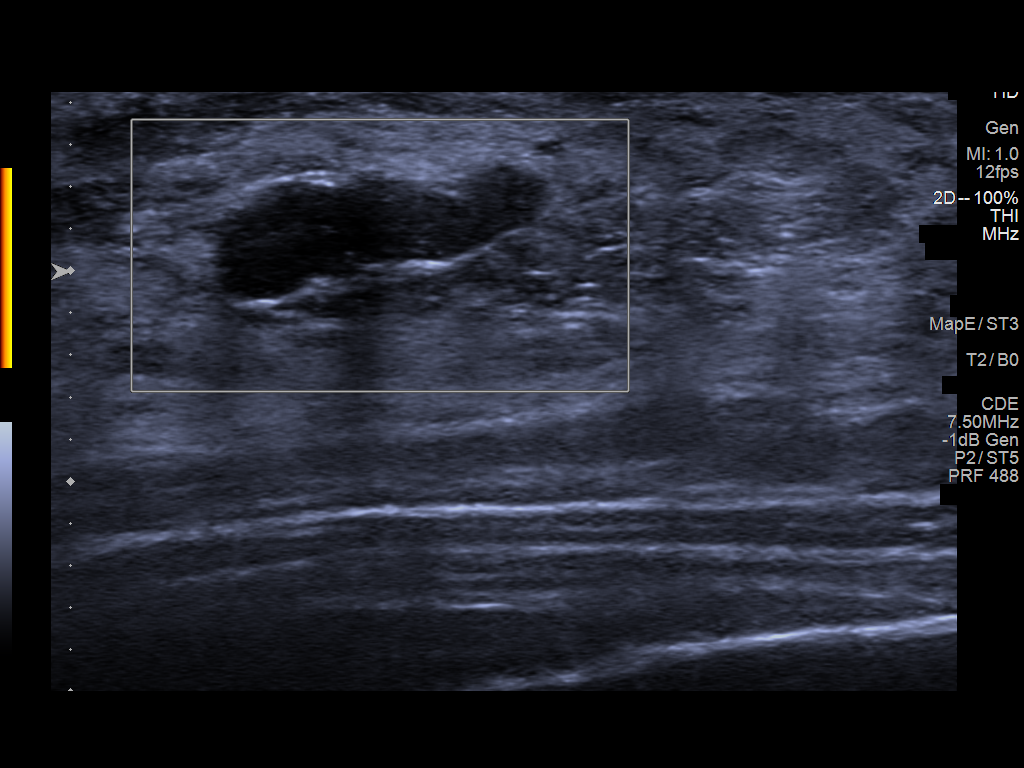
[im 6/6]
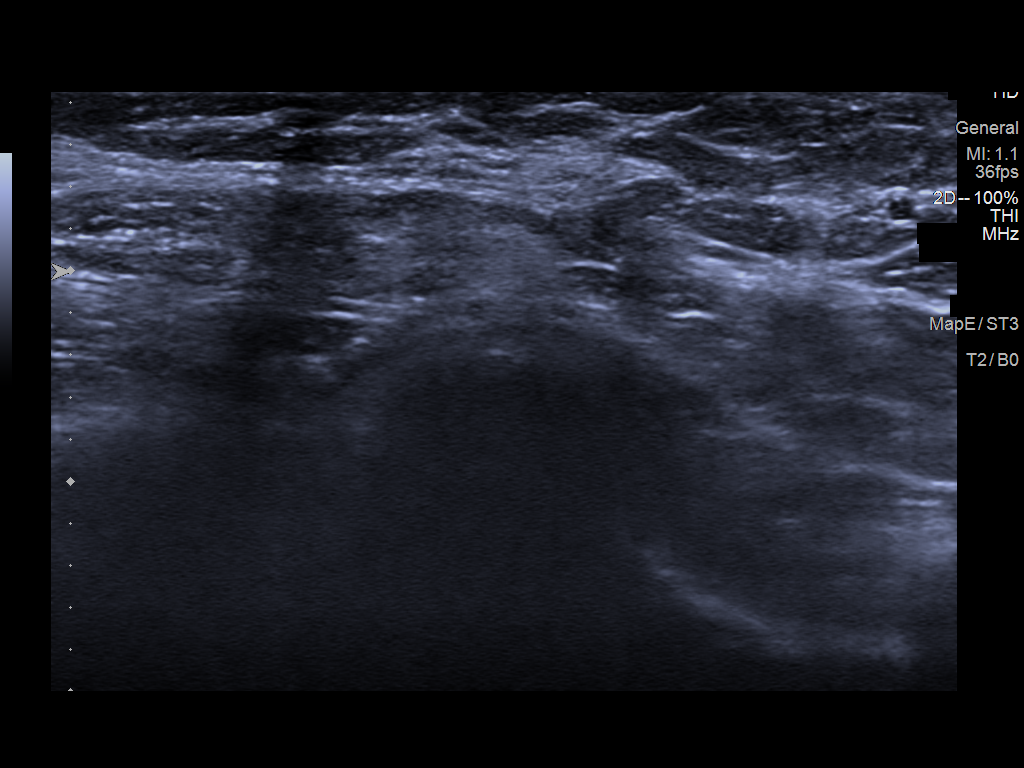

[6 of 6 positions shown; findings below may reference images not displayed]

ACR Breast Density Category c: The breast tissue is heterogeneously
dense, which may obscure small masses.sound
FINDINGS: There is a persistent low-density lobular mass within the
retroareolar right breast, further evaluated with additional
imaging.

Mammographic images were processed with CAD.

Targeted ultrasound is performed, showing a 0.8 x 0.4 x 1.7 cm
lobular hypoechoic mass retroareolar right breast 1 o'clock
position.

No right axillary adenopathy.
IMPRESSION: Indeterminate retroareolar right breast mass 1 o'clock position.

RECOMMENDATION:
Ultrasound-guided core needle biopsy of the retroareolar right
breast mass.

I have discussed the findings and recommendations with the patient.
If applicable, a reminder letter will be sent to the patient
regarding the next appointment.

BI-RADS CATEGORY  4: Suspicious.

## 2021-05-09 IMAGING — MG MM DIGITAL DIAGNOSTIC UNILAT*R* W/ TOMO W/ CAD
4 series · 4 of 12 positions shown · non-contrast
Comparison: Previous exam(s).

CLINICAL DATA: Patient recalled from screening for right breast
mass.

EXAM:
DIGITAL DIAGNOSTIC RIGHT MAMMOGRAM WITH CAD AND TOMO
ULTRASOUND RIGHT BREAST

[R CC synth-2D]
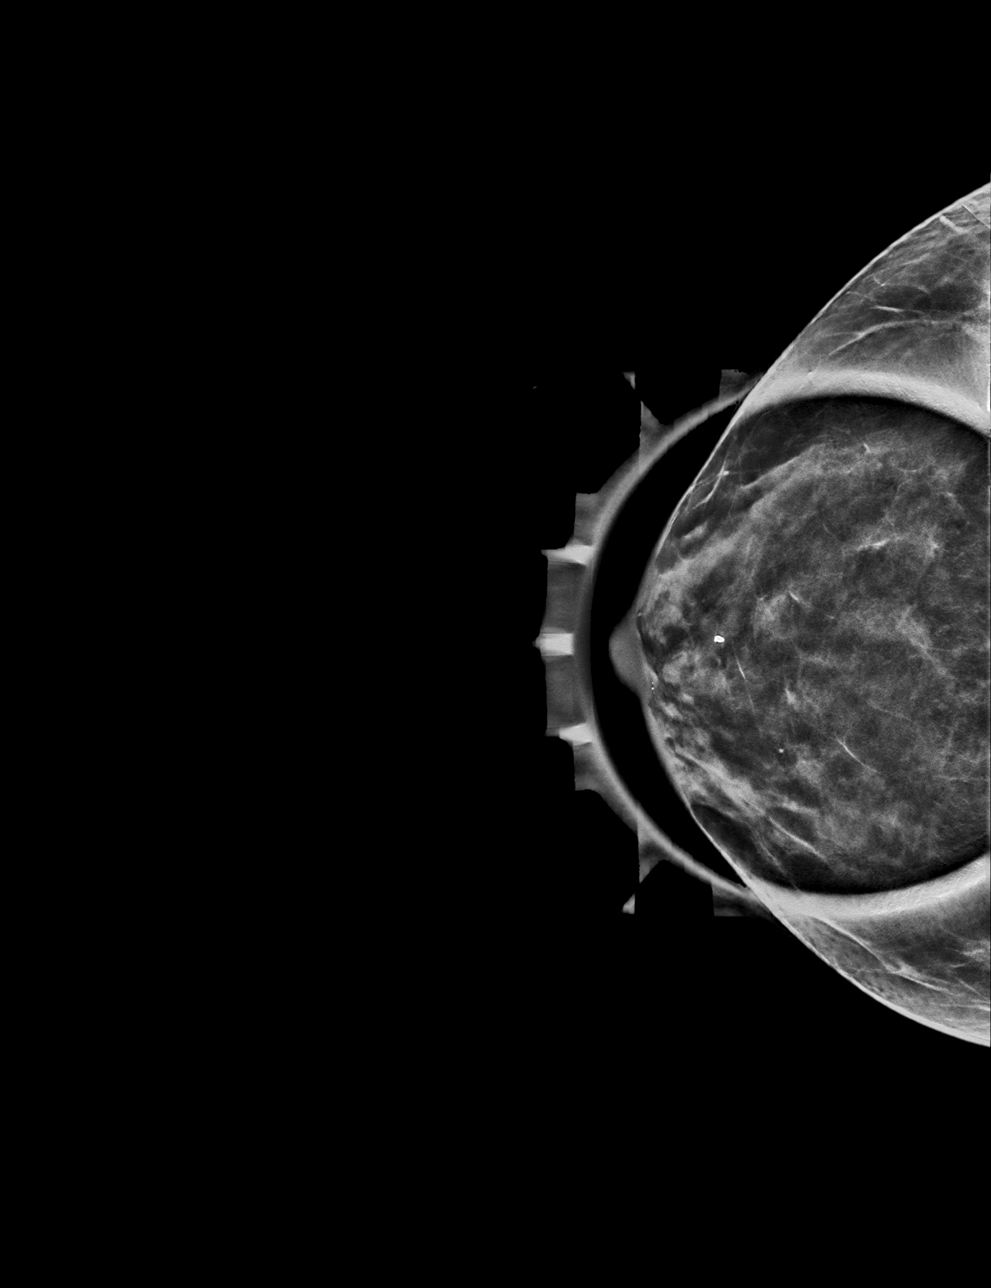

[R MLO synth-2D]
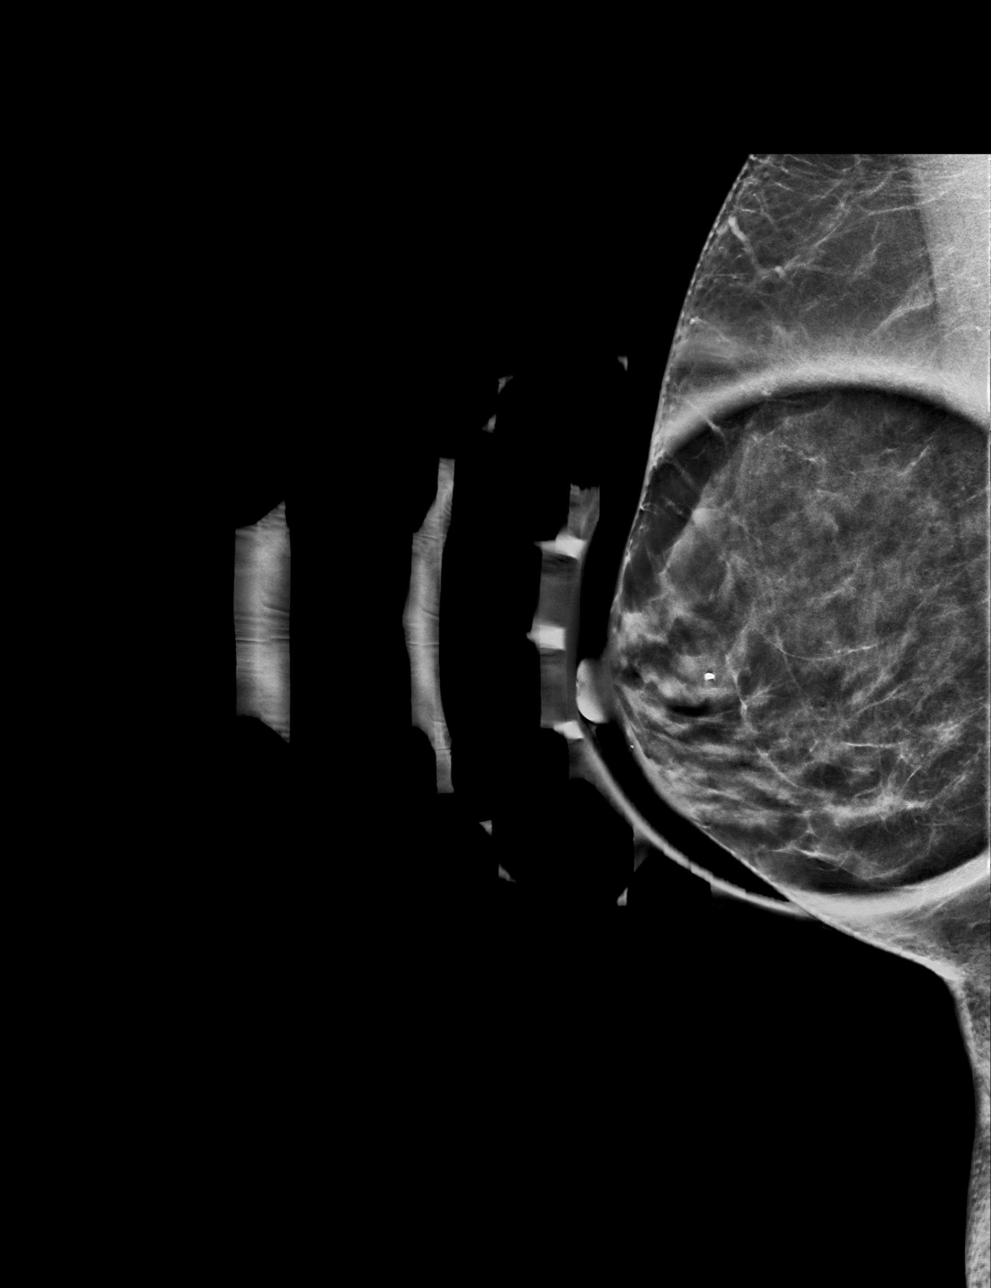

[R CC tomo · tomo slice 25/50.0]
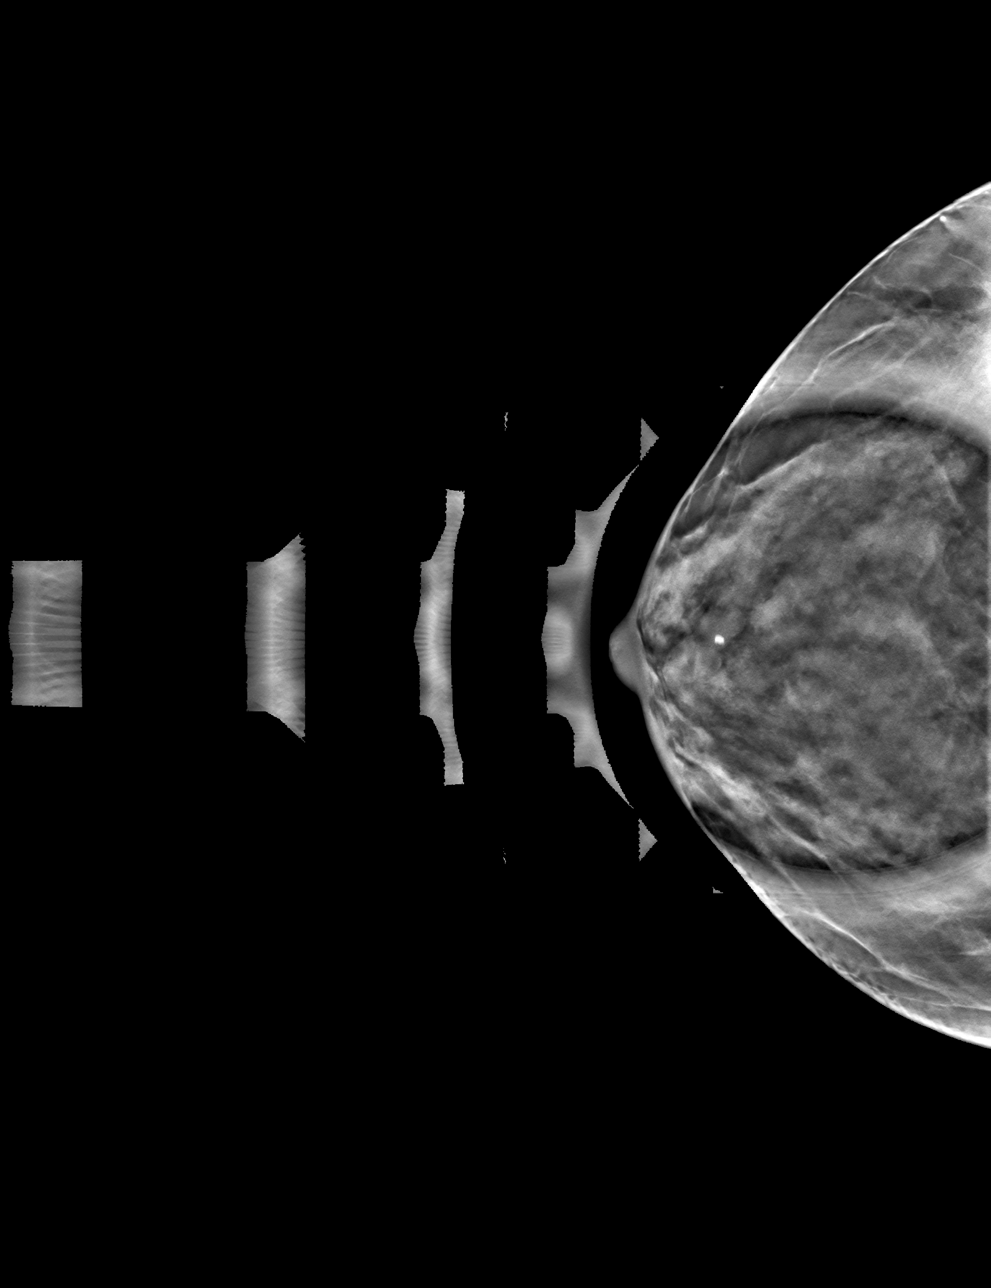

[R MLO tomo · tomo slice 31/60.0]
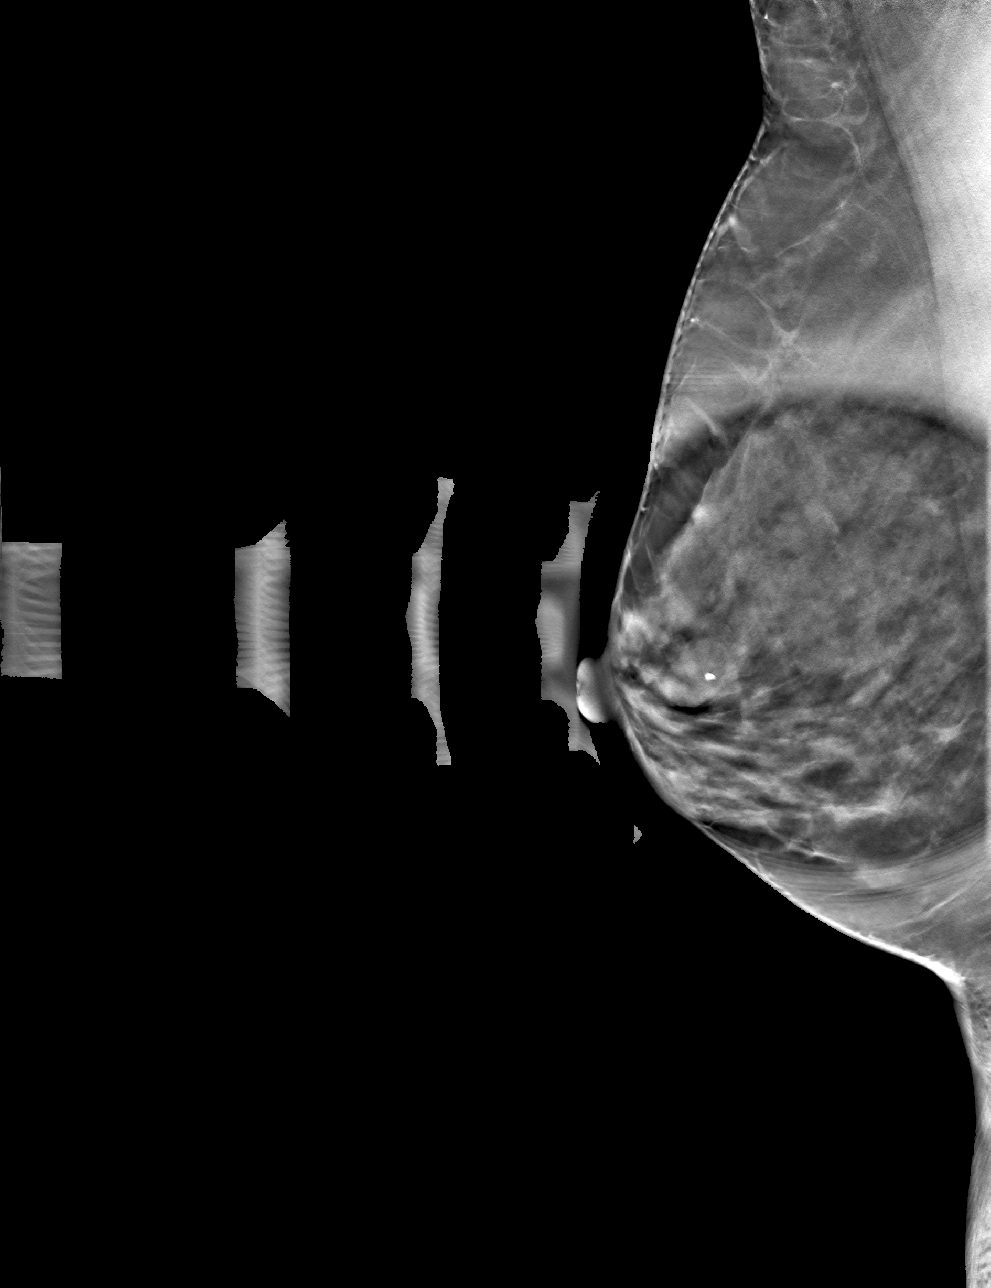

[4 of 12 positions shown; findings below may reference images not displayed]

ACR Breast Density Category c: The breast tissue is heterogeneously
dense, which may obscure small masses.sound
FINDINGS: There is a persistent low-density lobular mass within the
retroareolar right breast, further evaluated with additional
imaging.

Mammographic images were processed with CAD.

Targeted ultrasound is performed, showing a 0.8 x 0.4 x 1.7 cm
lobular hypoechoic mass retroareolar right breast 1 o'clock
position.

No right axillary adenopathy.
IMPRESSION: Indeterminate retroareolar right breast mass 1 o'clock position.

RECOMMENDATION:
Ultrasound-guided core needle biopsy of the retroareolar right
breast mass.

I have discussed the findings and recommendations with the patient.
If applicable, a reminder letter will be sent to the patient
regarding the next appointment.

BI-RADS CATEGORY  4: Suspicious.

## 2021-05-23 IMAGING — MG US  BREAST BX W/ LOC DEV 1ST LESION IMG BX SPEC US GUIDE*R*
1 series · 8 of 8 positions shown · non-contrast
Comparison: Previous exam(s).
COMPARISON: Previous exam(s).

Addendum:
CLINICAL DATA: 44-year-old female with an indeterminate mass in the
right breast at 1 o'clock retroareolar.

EXAM:
ULTRASOUND GUIDED RIGHT BREAST CORE NEEDLE BIOPSY

[Series 1: MG view · 0.06mm/px · 8 of 15 slices shown]
[im 1/15]
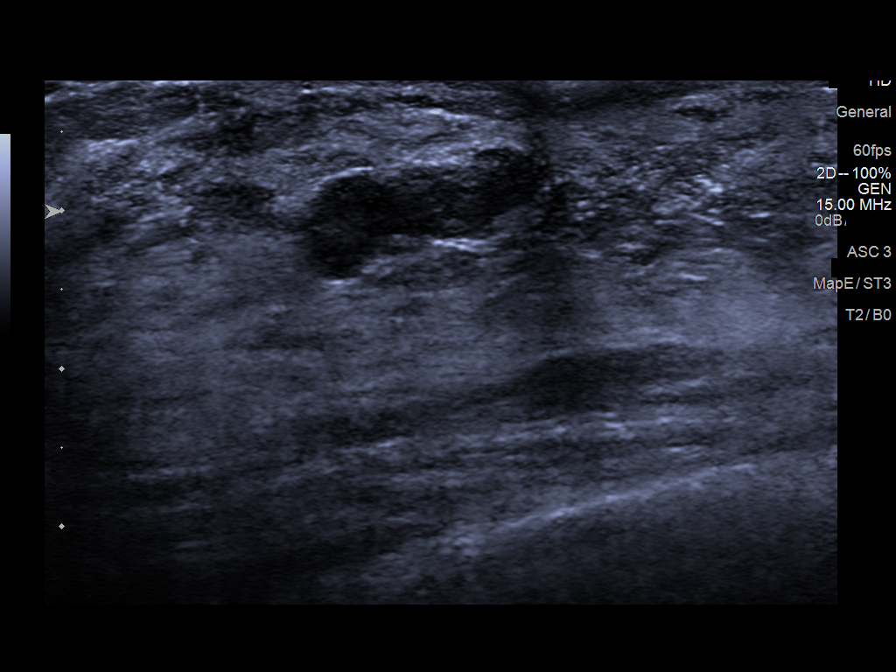
[im 3/15]
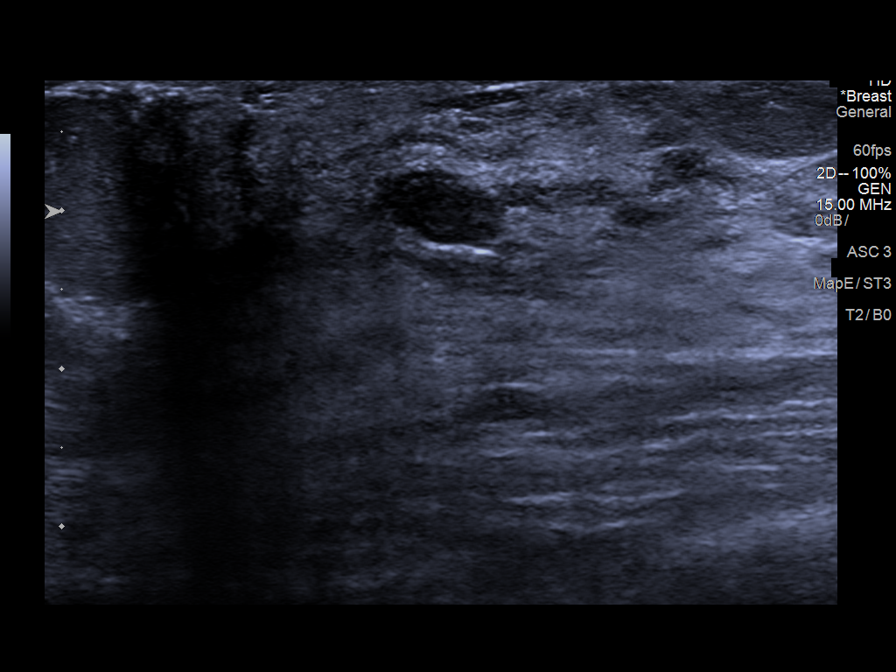
[im 5/15]
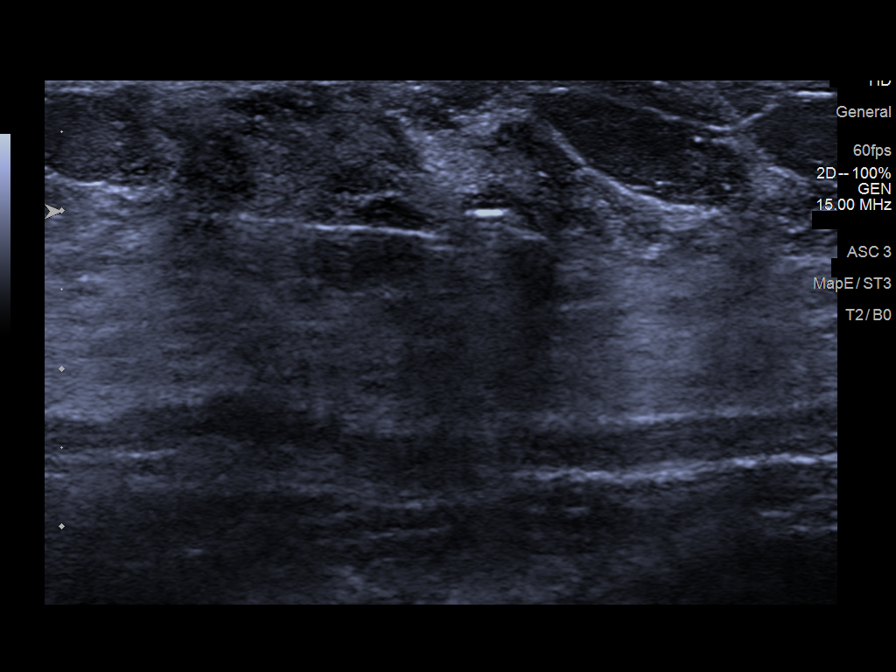
[im 7/15]
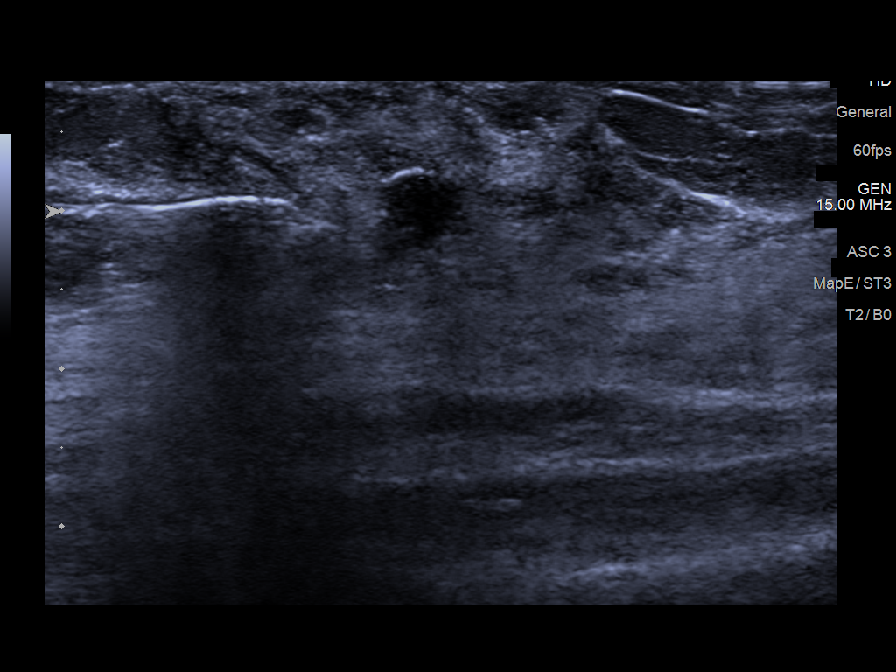
[im 9/15]
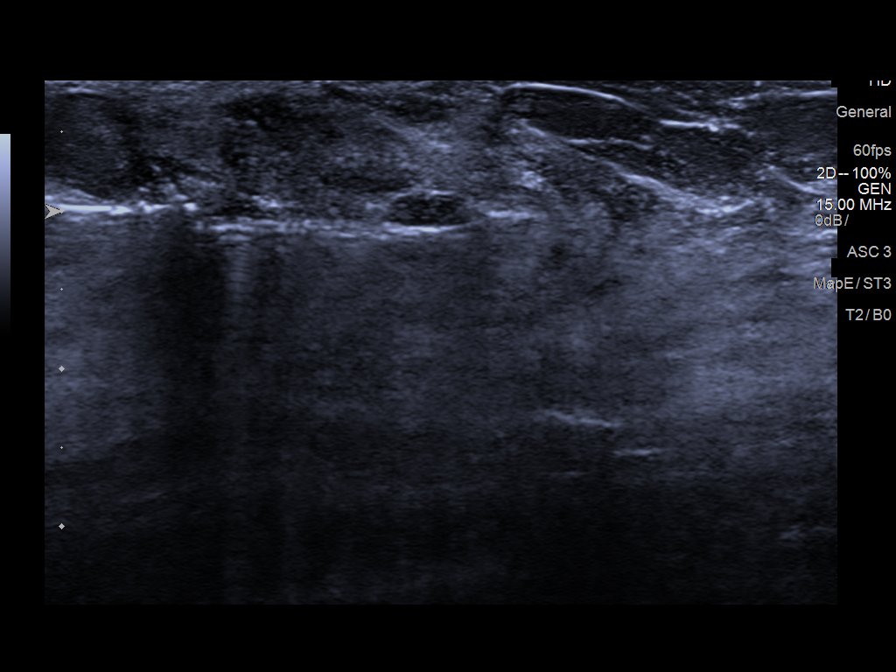
[im 11/15]
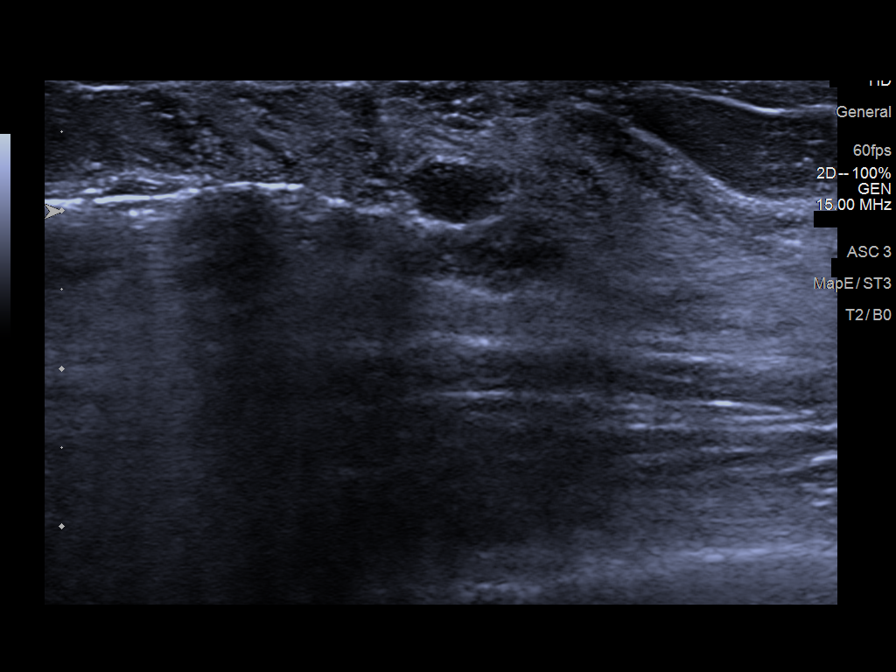
[im 13/15]
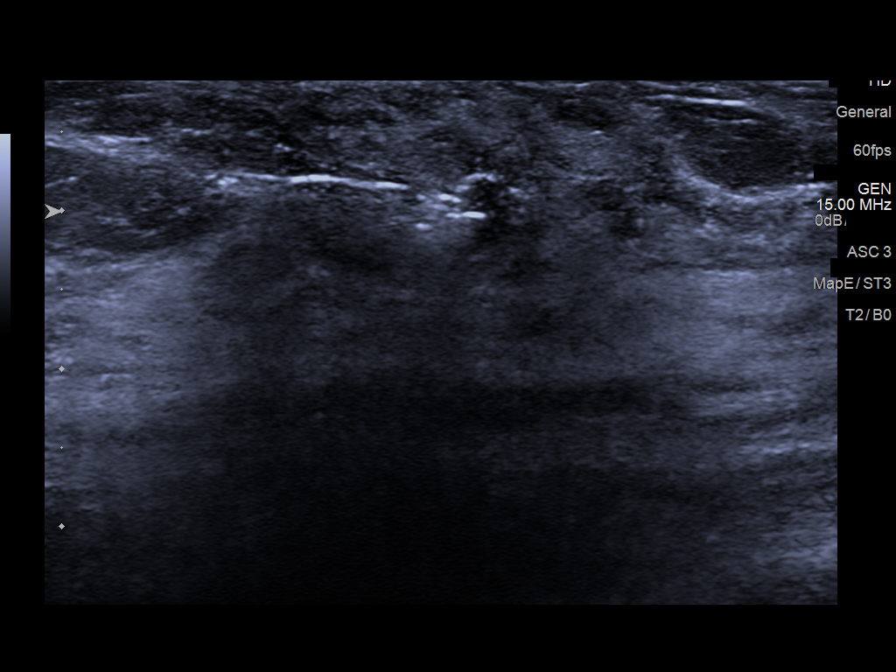
[im 15/15]
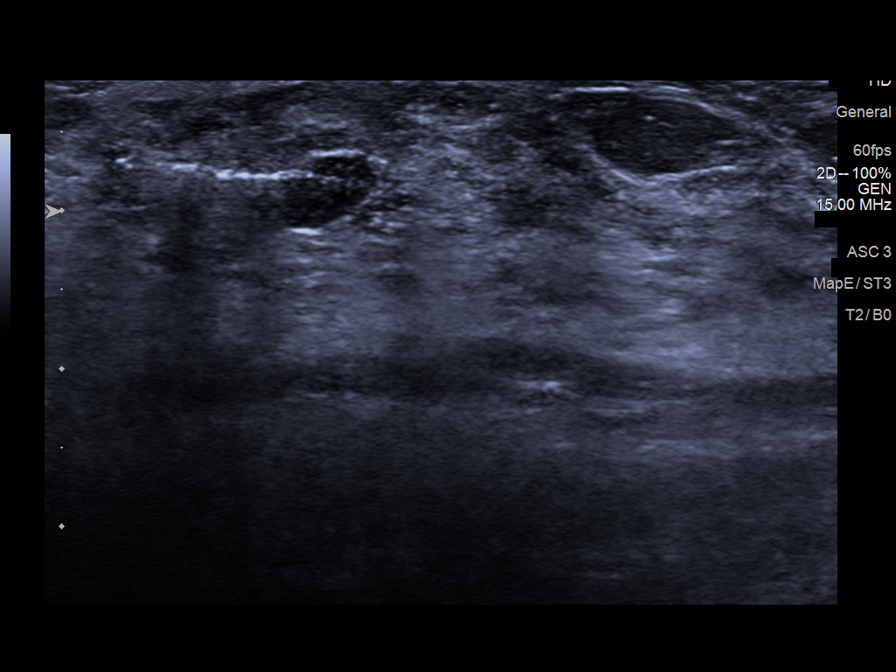

[8 of 8 positions shown; findings below may reference images not displayed]



Lesion quadrant: Upper inner

Using sterile technique and 1% Lidocaine as local anesthetic, under
direct ultrasound visualization, a 14 gauge Harini device was
used to perform biopsy of the mass in the right breast at 1 o'clock
retroareolar using a lateral to medial approach. At the conclusion
of the procedure Q shaped tissue marker clip was deployed into the
biopsy cavity. Follow up 2 view mammogram was performed and dictated
separately.
IMPRESSION: Ultrasound guided biopsy of the mass in the right breast at 1
o'clock retroareolar. No apparent complications.

ADDENDUM:
PATHOLOGY revealed: A. BREAST, RIGHT AT [DATE], RETROAREOLAR;
ULTRASOUND-GUIDED CORE NEEDLE BIOPSY: - BENIGN MAMMARY PARENCHYMA
WITH DENSE STROMAL FIBROSIS, FIBROADENOMATOID CHANGES, AND USUAL
DUCTAL HYPERPLASIA. - NEGATIVE FOR ATYPICAL PROLIFERATIVE BREAST
DISEASE.

Pathology results are CONCORDANT with imaging findings, per Dr.
Blain Jumper.

Pathology results and recommendations below were discussed with
patient by telephone on 12/28/2019. Patient reported biopsy site
within normal limits with slight tenderness at the site. Post biopsy
care instructions were reviewed, questions were answered and my
direct phone number was provided to patient. Patient was instructed
to call [HOSPITAL] if any concerns or questions arise
related to the biopsy.

Recommendation: Patient instructed to return in six months for
unilateral RIGHT breast diagnostic mammogram and possible ultrasound
to assess stability of biopsied mass. Patient informed a reminder
notice will be sent regarding this appointment.

Pathology results reported by Ziy Bu RN on 12/28/2019.



Lesion quadrant: Upper inner

Using sterile technique and 1% Lidocaine as local anesthetic, under
direct ultrasound visualization, a 14 gauge Harini device was
used to perform biopsy of the mass in the right breast at 1 o'clock
retroareolar using a lateral to medial approach. At the conclusion
of the procedure Q shaped tissue marker clip was deployed into the
biopsy cavity. Follow up 2 view mammogram was performed and dictated
separately.
IMPRESSION: Ultrasound guided biopsy of the mass in the right breast at 1
o'clock retroareolar. No apparent complications.

## 2021-06-01 ENCOUNTER — Telehealth: Payer: Self-pay | Admitting: Urology

## 2021-06-01 NOTE — Telephone Encounter (Signed)
Patient would like to set up a cysto. Can we go ahead and schedule that or does she need to see you first? Thanks ?

## 2021-06-22 NOTE — Progress Notes (Incomplete)
06/22/21 ?11:26 AM  ? ?Kelly Colon ?1975-07-24 ?[id number] ? ?Referring provider:  ?Rusty Aus, MD ?[place] ?[facility] ?[place],  [place] [address] ?No chief complaint on file. ? ? ?Urological history: ?1. Interstitial cystitis ?-contributing factors of IBD ?-managed with behavioral modification and Uribel  ? ?2. Dysuria  ?- Given samples of Uribel on 12/02/2020 ? ?3. Vaginal atrophy  ?- Managed on a estrogen patch and vaginal insert  ?- She was given a trial of Premarin cream to use topically at the anterior portion of the introitus on 12/02/2020 ? ?HPI: ?Kelly Colon is a 46 y.o.female who presents today for a follow-up.  ? ? ? ? ? ?PMH: ?Past Medical History:  ?Diagnosis Date  ? Crohn's disease ([place])   ? DVT (deep venous thrombosis) ([place])   ? Endometriosis   ? Gastric ulcer   ? Headache   ? Migraine  ? Hyperlipidemia   ? Hypothyroidism   ? Osteopenia   ? Osteoporosis   ? ? ?Surgical History: ?Past Surgical History:  ?Procedure Laterality Date  ? ABDOMINAL HYSTERECTOMY  2017  ? APPENDECTOMY    ? BREAST CYST EXCISION Right   ? when she was 18  ? COLONOSCOPY WITH PROPOFOL N/A 01/14/2015  ? Procedure: COLONOSCOPY WITH PROPOFOL;  Surgeon: Manya Silvas, MD;  Location: [facility] ENDOSCOPY;  Service: Endoscopy;  Laterality: N/A;  ? LAPAROSCOPY    ? x 4, for treatment of endometriosis  ? OOPHORECTOMY Left   ? PELVIC LAPAROSCOPY    ? 2005, 2007, 2010, 2013  ? THYROIDECTOMY    ? ? ?Home Medications:  ?Allergies as of 06/23/2021   ? ?   Reactions  ? Ciprofloxacin Other (See Comments)  ? GI upset  ? Hydrocodone-acetaminophen Hives  ? Vicodin [hydrocodone-acetaminophen]   ? ?  ? ?  ?Medication List  ?  ? ?  ? Accurate as of June 22, 2021 11:26 AM. If you have any questions, ask your nurse or doctor.  ?  ?  ? ?  ? ?Azelastine HCl 0.15 % Soln ?Place 2 sprays into the nose 2 (two) times daily as needed. ?  ?calcium-vitamin D 250-100 MG-UNIT tablet ?Take 2 tablets by mouth 2 (two) times daily. ?   ?cyclobenzaprine 10 MG tablet ?Commonly known as: FLEXERIL ?  ?docusate sodium 100 MG capsule ?Commonly known as: COLACE ?Take by mouth. ?  ?erythromycin with ethanol 2 % external solution ?Commonly known as: THERAMYCIN ?APPLY TO FACE DAILY AS NEEDED ?  ?estradiol 0.1 MG/24HR patch ?Commonly known as: VIVELLE-DOT ?Place onto the skin. ?  ?fluconazole 100 MG tablet ?Commonly known as: DIFLUCAN ?Take 100 mg by mouth daily. ?  ?levothyroxine 75 MCG tablet ?Commonly known as: SYNTHROID ?Take 75 mcg by mouth daily before breakfast. ?  ?magnesium oxide 400 MG tablet ?Commonly known as: MAG-OX ?Take by mouth. ?  ?Multi-Vitamin tablet ?Take 1 tablet by mouth daily. ?  ?triamcinolone cream 0.1 % ?Commonly known as: KENALOG ?Apply topically 2 (two) times daily as needed. ?  ?Uribel 118 MG Caps ?Take 1 capsule (118 mg total) by mouth every 6 (six) hours as needed. ?  ?Vitamin E 30000 UNIT/60GM Crea ?Apply 1 Units topically daily. ?  ?Yuvafem 10 MCG Tabs vaginal tablet ?Generic drug: Estradiol ?Place vaginally. ?  ?zolpidem 12.5 MG CR tablet ?Commonly known as: Ambien CR ?Take 1 tablet (12.5 mg total) by mouth at bedtime as needed for sleep. ?  ? ?  ? ? ?Allergies:  ?  Allergies  ?Allergen Reactions  ? Ciprofloxacin Other (See Comments)  ?  GI upset  ? Hydrocodone-Acetaminophen Hives  ? Vicodin [Hydrocodone-Acetaminophen]   ? ? ?Family History: ?Family History  ?Problem Relation Age of Onset  ? Endometriosis Mother   ? Stroke Mother   ? Heart attack Father   ? Heart attack Paternal Grandmother   ? Cancer Paternal Grandfather   ?     throat, stomach  ? Breast cancer Neg Hx   ? Prostate cancer Neg Hx   ? Bladder Cancer Neg Hx   ? Kidney cancer Neg Hx   ? ? ?Social History:  reports that she has never smoked. She has never used smokeless tobacco. She reports that she does not drink alcohol and does not use drugs. ? ? ?Physical Exam: ?LMP 05/29/2013   ?Constitutional:  Alert and oriented, No acute distress. ?HEENT: [place] AT, moist  mucus membranes.  Trachea midline, no masses. ?Cardiovascular: No clubbing, cyanosis, or edema. ?Respiratory: Normal respiratory effort, no increased work of breathing. ?Skin: No rashes, bruises or suspicious lesions. ?Neurologic: Grossly intact, no focal deficits, moving all 4 extremities. ?Psychiatric: Normal mood and affect. ? ? ?Laboratory Data: ?Lab Results  ?Component Value Date  ? CREATININE 0.81 03/30/2014  ? ? Ref Range & Units 3 mo ago  ?Thyroid Stimulating Hormone (TSH) 0.450-5.330 uIU/ml uIU/mL 0.098 Low    ?Comment: Reference Range for Pregnant Females >= 63 yrs old:  ?Normal Range for 1st trimester: 0.05-3.70 ulU/ml  ?Normal Range for 2nd trimester: 0.31-4.35 ulU/ml  ?Resulting Agency  [facility]  ?Specimen Collected: 02/24/21 09:15 Last Resulted: 02/24/21 14:51  ?Received From: [facility]  Result Received: 06/01/21 12:53  ? ? Ref Range & Units 3 mo ago  ?Vitamin B12 >300 pg/mL 675   ?Resulting Agency  [facility]  ?Narrative ?Performed by [facility] - LAB ?<200 pg/mL:    Low, consistent with Vitamin B12 Deficiency  ?200-300 pg/mL: Borderline, possible Vitamin B12 Deficiency  ?>300 pg/mL:    Normal.  Vitamin B12 Deficiency is unlikely ?Specimen Collected: 02/24/21 09:15 Last Resulted: 02/24/21 14:51  ?Received From: [facility]  Result Received: 06/01/21 12:53  ? ? Ref Range & Units 3 mo ago  ?Glucose 70 - 110 mg/dL 87   ?Sodium 136 - 145 mmol/L 140   ?Potassium 3.6 - 5.1 mmol/L 5.0   ?Chloride 97 - 109 mmol/L 104   ?Carbon Dioxide (CO2) 22.0 - 32.0 mmol/L 28.7   ?Urea Nitrogen (BUN) 7 - 25 mg/dL 11   ?Creatinine 0.6 - 1.1 mg/dL 0.7   ?Glomerular Filtration Rate (eGFR), MDRD Estimate >60 mL/min/1.73sq m 90   ?Calcium 8.7 - 10.3 mg/dL 9.4   ?AST  8 - 39 U/L 15   ?ALT  5 - 38 U/L 20   ?Alk Phos (alkaline Phosphatase) 34 - 104 U/L 76   ?Albumin 3.5 - 4.8 g/dL 4.3   ?Bilirubin, Total 0.3 - 1.2 mg/dL 0.6   ?Protein, Total 6.1 -  7.9 g/dL 6.8   ?A/G Ratio 1.0 - 5.0 gm/dL 1.7   ?Resulting Agency  [facility]  ?Specimen Collected: 02/24/21 09:15 Last Resulted: 02/24/21 14:59  ?Received From: [facility]  Result Received: 06/01/21 12:53  ? ? ?Pertinent Imaging: ? ? ?Assessment & Plan:   ? ? ?No follow-ups on file. ? ?[facility] ?[address], [address] ?[place], [place] [address] ?[phone] ? ?I,Kailey Littlejohn,acting  as a Education administrator for [employer], PA-C.,have documented all relevant documentation on the behalf of SHANNON MCGOWAN, PA-C,as directed by  [facility], PA-C while in the presence of [place], PA-C. ?

## 2021-06-23 ENCOUNTER — Ambulatory Visit: Payer: BC Managed Care – PPO | Admitting: Urology

## 2021-06-23 ENCOUNTER — Encounter: Payer: Self-pay | Admitting: Urology

## 2021-08-11 ENCOUNTER — Ambulatory Visit: Payer: BC Managed Care – PPO | Admitting: Physical Therapy

## 2021-08-14 ENCOUNTER — Ambulatory Visit: Payer: BC Managed Care – PPO | Admitting: Physical Therapy

## 2021-08-31 ENCOUNTER — Encounter: Payer: Self-pay | Admitting: Urology

## 2021-08-31 ENCOUNTER — Ambulatory Visit: Payer: BC Managed Care – PPO | Admitting: Urology

## 2021-08-31 VITALS — BP 120/80 | HR 71 | Ht 62.0 in | Wt 127.0 lb

## 2021-08-31 DIAGNOSIS — N952 Postmenopausal atrophic vaginitis: Secondary | ICD-10-CM

## 2021-08-31 DIAGNOSIS — G588 Other specified mononeuropathies: Secondary | ICD-10-CM

## 2021-08-31 DIAGNOSIS — R300 Dysuria: Secondary | ICD-10-CM | POA: Diagnosis not present

## 2021-08-31 NOTE — Progress Notes (Signed)
08/31/21 4:41 PM   Kelly Colon [birth year] [id number]  Referring provider:  Rusty Aus, MD [facility] [place] [place],  [place] [id number]  Urological history  Dysuria  - Given Uribel sample in 11/2020  2. Vaginal atrophy  - Managed on estrogen patch and vaginal insert   Chief Complaint  Patient presents with   Follow-up    HPI: Kelly Colon is a 46 y.o.female who presents for a follow-up on symptoms.  She reports that she is referred to physical therapy. She has numbness  in her upper peritoneum into the upper pubic area.  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  She also attends spin classes.  She reports that oral estrogen and estrogen cream have not improved her vaginal atrophy.   She also has been experiencing dysuria and urinary urgency for several months.  She also has been having some issues with stress which she feels may be contributing to her symptoms as well.  Patient denies any modifying or aggravating factors.  Patient denies any gross hematuria or suprapubic/flank pain.  Patient denies any fevers, chills, nausea or vomiting.   Her UA is clear.  PMH: Past Medical History:  Diagnosis Date   Crohn's disease ([place])    DVT (deep venous thrombosis) ([place])    Endometriosis    Gastric ulcer    Headache    Migraine   Hyperlipidemia    Hypothyroidism    Osteopenia    Osteoporosis     Surgical History: Past Surgical History:  Procedure Laterality Date   ABDOMINAL HYSTERECTOMY  2017   APPENDECTOMY     BREAST CYST EXCISION Right    when she was 18   COLONOSCOPY WITH PROPOFOL N/A 01/14/2015   Procedure: COLONOSCOPY WITH PROPOFOL;  Surgeon: Manya Silvas, MD;  Location: [facility];  Service: Endoscopy;  Laterality: N/A;   LAPAROSCOPY     x 4, for treatment of endometriosis   OOPHORECTOMY Left    PELVIC LAPAROSCOPY     2005, 2007, 2010, 2013   THYROIDECTOMY      Home  Medications:  Allergies as of 08/31/2021       Reactions   Ciprofloxacin Other (See Comments)   GI upset   Hydrocodone-acetaminophen Hives   Vicodin [hydrocodone-acetaminophen]         Medication List        Accurate as of August 31, 2021  4:41 PM. If you have any questions, ask your nurse or doctor.          Azelastine HCl 0.15 % Soln Place 2 sprays into the nose 2 (two) times daily as needed.   calcium-vitamin D 250-100 MG-UNIT tablet Take 2 tablets by mouth 2 (two) times daily.   cyclobenzaprine 10 MG tablet Commonly known as: FLEXERIL   docusate sodium 100 MG capsule Commonly known as: COLACE Take by mouth.   erythromycin with ethanol 2 % external solution Commonly known as: THERAMYCIN APPLY TO FACE DAILY AS NEEDED   estradiol 0.1 MG/24HR patch Commonly known as: VIVELLE-DOT Place onto the skin.   fluconazole 100 MG tablet Commonly known as: DIFLUCAN Take 100 mg by mouth daily.   levothyroxine 75 MCG tablet Commonly known as: SYNTHROID Take 75 mcg by mouth daily before breakfast.   magnesium oxide 400 MG tablet Commonly known as: MAG-OX Take by mouth.   Multi-Vitamin tablet Take 1 tablet by mouth daily.   triamcinolone cream 0.1 %  Commonly known as: KENALOG Apply topically 2 (two) times daily as needed.   Uribel 118 MG Caps Take 1 capsule (118 mg total) by mouth every 6 (six) hours as needed.   Vitamin E 30000 UNIT/60GM Crea Apply 1 Units topically daily.   Yuvafem 10 MCG Tabs vaginal tablet Generic drug: Estradiol Place vaginally.   zolpidem 12.5 MG CR tablet Commonly known as: Ambien CR Take 1 tablet (12.5 mg total) by mouth at bedtime as needed for sleep.        Allergies:  Allergies  Allergen Reactions   Ciprofloxacin Other (See Comments)    GI upset   Hydrocodone-Acetaminophen Hives   Vicodin [Hydrocodone-Acetaminophen]     Family History: Family History  Problem Relation Age of Onset   Endometriosis Mother    Stroke  Mother    Heart attack Father    Heart attack Paternal Grandmother    Cancer Paternal Grandfather        throat, stomach   Breast cancer Neg Hx    Prostate cancer Neg Hx    Bladder Cancer Neg Hx    Kidney cancer Neg Hx     Social History:  reports that she has never smoked. She has never used smokeless tobacco. She reports that she does not drink alcohol and does not use drugs.   Physical Exam: BP 120/80   Pulse 71   Ht 5' 2"  (1.575 m)   Wt 127 lb (57.6 kg)   LMP 05/29/2013   BMI 23.23 kg/m   Constitutional:  Alert and oriented, No acute distress. HEENT: [place] AT, moist mucus membranes.  Trachea midline Cardiovascular: No clubbing, cyanosis, or edema. Respiratory: Normal respiratory effort, no increased work of breathing. Neurologic: Grossly intact, no focal deficits, moving all 4 extremities. Psychiatric: Normal mood and affect.  Laboratory Data: WBC (White Blood Cell Count) 4.1 - 10.2 10^3/uL 5.7   RBC (Red Blood Cell Count) 4.04 - 5.48 10^6/uL 4.38   Hemoglobin 12.0 - 15.0 gm/dL 13.2   Hematocrit 35.0 - 47.0 % 40.0   MCV (Mean Corpuscular Volume) 80.0 - 100.0 fl 91.3   MCH (Mean Corpuscular Hemoglobin) 27.0 - 31.2 pg 30.1   MCHC (Mean Corpuscular Hemoglobin Concentration) 32.0 - 36.0 gm/dL 33.0   Platelet Count 150 - 450 10^3/uL 255   RDW-CV (Red Cell Distribution Width) 11.6 - 14.8 % 13.3   MPV (Mean Platelet Volume) 9.4 - 12.4 fl 9.9   Neutrophils 1.50 - 7.80 10^3/uL 3.08   Lymphocytes 1.00 - 3.60 10^3/uL 1.92   Monocytes 0.00 - 1.50 10^3/uL 0.53   Eosinophils 0.00 - 0.55 10^3/uL 0.11   Basophils 0.00 - 0.09 10^3/uL 0.02   Neutrophil % 32.0 - 70.0 % 54.2   Lymphocyte % 10.0 - 50.0 % 33.8   Monocyte % 4.0 - 13.0 % 9.3   Eosinophil % 1.0 - 5.0 % 1.9   Basophil% 0.0 - 2.0 % 0.4   Immature Granulocyte % <=0.7 % 0.4   Immature Granulocyte Count <=0.06 10^3/L 0.02   Resulting Agency  [facility]   Specimen Collected: 02/24/21 09:15 Last Resulted:  02/24/21 10:00  Received From: [facility]  Result Received: 06/01/21 12:53   Glucose 70 - 110 mg/dL 87   Sodium 136 - 145 mmol/L 140   Potassium 3.6 - 5.1 mmol/L 5.0   Chloride 97 - 109 mmol/L 104   Carbon Dioxide (CO2) 22.0 - 32.0 mmol/L 28.7   Urea Nitrogen (BUN) 7 - 25 mg/dL 11  Creatinine 0.6 - 1.1 mg/dL 0.7   Glomerular Filtration Rate (eGFR), MDRD Estimate >60 mL/min/1.73sq m 90   Calcium 8.7 - 10.3 mg/dL 9.4   AST  8 - 39 U/L 15   ALT  5 - 38 U/L 20   Alk Phos (alkaline Phosphatase) 34 - 104 U/L 76   Albumin 3.5 - 4.8 g/dL 4.3   Bilirubin, Total 0.3 - 1.2 mg/dL 0.6   Protein, Total 6.1 - 7.9 g/dL 6.8   A/G Ratio 1.0 - 5.0 gm/dL 1.7   Resulting Agency  [facility]   Specimen Collected: 02/24/21 09:15 Last Resulted: 02/24/21 14:59  Received From: [facility]  Result Received: 06/01/21 12:53   Color Yellow, Violet, Light Violet, Dark Violet Yellow   Clarity Clear, Other Clear   Specific Gravity 1.000 - 1.030 1.010   pH, Urine 5.0 - 8.0 6.5   Protein, Urinalysis Negative, Trace mg/dL Negative   Glucose, Urinalysis Negative mg/dL Negative   Ketones, Urinalysis Negative mg/dL Negative   Blood, Urinalysis Negative Negative   Nitrite, Urinalysis Negative Negative   Leukocyte Esterase, Urinalysis Negative Negative   White Blood Cells, Urinalysis None Seen, 0-3 /hpf None Seen   Red Blood Cells, Urinalysis None Seen, 0-3 /hpf None Seen   Bacteria, Urinalysis None Seen /hpf Rare Abnormal    Squamous Epithelial Cells, Urinalysis Rare, Few, None Seen /hpf Rare   Resulting Agency  [facility]   Specimen Collected: 02/24/21 09:15 Last Resulted: 02/24/21 12:30  Received From: [facility]  Result Received: 06/01/21 12:53  Urinalysis See HPI.  See Epic.  I have reviewed the labs.   Assessment & Plan:    Dysuria  - UA benign  - Urine sent for culture to rule out indolent infection  - Currently  is being managed with Uribel  - Will schedule cystoscopy with Dr Erlene Quan to rule out any that may be a causative agent for dysuria although this will likely be of low yield and have explained that to the patient  2.  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 Vaginal atrophy  - currently on oral estrogen and vaginal estrogen cream which were not effective  - She has an appointment with gynecologist tomorrow.   Return for cysto for dysuria .  [facility] [address], [place], [place] [address] [phone]  I, Kirke Shaggy Littlejohn,acting as a scribe for [facility], PA-C.,have documented all relevant documentation on the behalf of Ayme Short, PA-C,as directed by  [facility], PA-C while in the presence of [place], PA-C.  I have reviewed the above documentation for accuracy and completeness, and I agree with the above.    Zara Council, PA-C

## 2021-09-01 LAB — URINALYSIS, COMPLETE
Bilirubin, UA: NEGATIVE
Glucose, UA: NEGATIVE
Ketones, UA: NEGATIVE
Leukocytes,UA: NEGATIVE
Nitrite, UA: NEGATIVE
Protein,UA: NEGATIVE
RBC, UA: NEGATIVE
Specific Gravity, UA: 1.015 (ref 1.005–1.030)
Urobilinogen, Ur: 0.2 mg/dL (ref 0.2–1.0)
pH, UA: 6 (ref 5.0–7.5)

## 2021-09-01 LAB — MICROSCOPIC EXAMINATION
Epithelial Cells (non renal): >10 /hpf — AB (ref 0–10)
RBC, Urine: NONE SEEN /hpf (ref 0–2)

## 2021-09-05 ENCOUNTER — Encounter: Payer: Self-pay | Admitting: Urology

## 2021-09-05 ENCOUNTER — Ambulatory Visit: Payer: BC Managed Care – PPO | Admitting: Urology

## 2021-09-05 VITALS — BP 100/63 | HR 73 | Ht 62.0 in | Wt 127.0 lb

## 2021-09-05 DIAGNOSIS — N3010 Interstitial cystitis (chronic) without hematuria: Secondary | ICD-10-CM

## 2021-09-05 LAB — MICROSCOPIC EXAMINATION
Bacteria, UA: NONE SEEN
RBC, Urine: NONE SEEN /hpf (ref 0–2)

## 2021-09-05 LAB — URINALYSIS, COMPLETE
Bilirubin, UA: NEGATIVE
Glucose, UA: NEGATIVE
Ketones, UA: NEGATIVE
Leukocytes,UA: NEGATIVE
Nitrite, UA: NEGATIVE
Protein,UA: NEGATIVE
RBC, UA: NEGATIVE
Specific Gravity, UA: 1.02 (ref 1.005–1.030)
Urobilinogen, Ur: 0.2 mg/dL (ref 0.2–1.0)
pH, UA: 6.5 (ref 5.0–7.5)

## 2021-09-05 NOTE — Progress Notes (Signed)
   09/05/21  CC:  Chief Complaint  Patient presents with   Cysto     HPI: Kelly Colon is a 46 y.o.female with a personal history of dysuria; managed on uribel, and vaginal atrophy who presents today for a diagnostic cystoscopy.   She was seen in clinic recently by Kelly Cowboy, PA-C, on 08/31/2021. She was noted to have increased dysuria, possible pudendal nerve entrapment, and vaginal atrophy.   She reports today that she is increased her topical estrogen cream.  She also thinks that her symptoms are triggered by anxiety.   Vitals:   09/05/21 0853  BP: 100/63  Pulse: 73   NED. A&Ox3.   No respiratory distress   Abd soft, NT, ND Normal external genitalia with patent urethral meatus  Cystoscopy Procedure Note  Patient identification was confirmed, informed consent was obtained, and patient was prepped using Betadine solution.  Lidocaine jelly was administered per urethral meatus.    Procedure: - Flexible cystoscope introduced, without any difficulty.   - Thorough search of the bladder revealed:    normal urethral meatus    normal urothelium    no stones    no ulcers     no tumors    no urethral polyps    no trabeculation    - Stadium confirmation of left UO and the right UO was normal   Post-Procedure: - Patient tolerated the procedure well  Assessment/ Plan:  Dysuria  - Cystoscopy today unremarkable, normal anatomic variant noted  2. Anxiety  She feels like her symptoms are exasperated by this which I agree I asked her to address this more with her PCP she has agreed.   We did briefly discussed the addition of hydroxyzine which can be somewhat sedating, fever anxiolytic if she desires pharmacotherapy which is outside the scope of urology practice, referred to PCP as above   Return if symptoms worsen or fail to improve.  Kelly Colon as a Neurosurgeon for Kelly Scotland, MD.,have documented all relevant documentation on the behalf of Kelly Scotland,  MD,as directed by  Kelly Scotland, MD while in the presence of Kelly Scotland, MD.  I have reviewed the above documentation for accuracy and completeness, and I agree with the above.   Kelly Scotland, MD

## 2021-09-15 ENCOUNTER — Other Ambulatory Visit: Payer: BC Managed Care – PPO | Admitting: Urology

## 2021-09-20 ENCOUNTER — Encounter: Payer: BC Managed Care – PPO | Admitting: Physical Therapy

## 2021-09-22 ENCOUNTER — Encounter: Payer: Self-pay | Admitting: Physical Therapy

## 2021-09-22 ENCOUNTER — Ambulatory Visit: Payer: BC Managed Care – PPO | Attending: Urology | Admitting: Physical Therapy

## 2021-09-22 DIAGNOSIS — R2689 Other abnormalities of gait and mobility: Secondary | ICD-10-CM | POA: Insufficient documentation

## 2021-09-22 DIAGNOSIS — M533 Sacrococcygeal disorders, not elsewhere classified: Secondary | ICD-10-CM | POA: Diagnosis present

## 2021-09-22 DIAGNOSIS — R293 Abnormal posture: Secondary | ICD-10-CM | POA: Insufficient documentation

## 2021-09-22 NOTE — Patient Instructions (Signed)
Minisquat: Scoot buttocks back slight, hinge like you are looking at your reflection on a pond  Knees behind toes,  Inhale to "smell flowers"  Exhale on the rise "like rocket"  Do not lock knees, have more weight across ballmounds of feet, toes relaxed   10 reps x 3 x day   __  Proper sitting posture with feet on ground, not crossed  ___  Dietitian

## 2021-09-22 NOTE — Therapy (Deleted)
OUTPATIENT PHYSICAL THERAPY EVALUATION   Patient Name: Kelly Colon MRN: [id number] DOB:[birth year], 46 y.o., female Today's Date: 09/22/2021   PT End of Session - 09/22/21 0833     Visit Number 1    Number of Visits 10    Date for PT Re-Evaluation 12/01/21    PT Start Time 0809    PT Stop Time 0905    PT Time Calculation (min) 56 min             Past Medical History:  Diagnosis Date   Crohn's disease ([facility])    DVT (deep venous thrombosis) ([facility])    Endometriosis    Gastric ulcer    Headache    Migraine   Hyperlipidemia    Hypothyroidism    Osteopenia    Osteoporosis    Past Surgical History:  Procedure Laterality Date   ABDOMINAL HYSTERECTOMY  2017   APPENDECTOMY     BREAST CYST EXCISION Right    when she was 18   COLONOSCOPY WITH PROPOFOL N/A 01/14/2015   Procedure: COLONOSCOPY WITH PROPOFOL;  Surgeon: Scot Jun, MD;  Location: [facility];  Service: Endoscopy;  Laterality: N/A;   LAPAROSCOPY     x 4, for treatment of endometriosis   OOPHORECTOMY Left    PELVIC LAPAROSCOPY     2005, 2007, 2010, 2013   THYROIDECTOMY     Patient Active Problem List   Diagnosis Date Noted   DVT (deep venous thrombosis) ([facility]) 12/05/2018   History of DVT (deep vein thrombosis) 02/14/2018   Surgical menopause 10/19/2015   Hyperlipemia, mixed 08/06/2014   Migraine without aura 12/22/2013   Anxiety 12/22/2013   Insomnia 12/22/2013   Mollusca contagiosa 06/12/2013   Osteopenia 03/13/2013   Acquired hypothyroidism 03/13/2013   Crohn's disease ([facility]) 12/07/2011   Endometriosis 10/05/2011      REFERRING PROVIDER: Marvel Plan PA-C  REFERRING DIAG: Dysuria  Rationale for Evaluation and Treatment Rehabilitation  THERAPY DIAG:  Sacrococcygeal disorders, not elsewhere classified  Abnormal posture  Other abnormalities of gait and mobility  ONSET DATE:   SUBJECTIVE:                                                                                                                                                                                            SUBJECTIVE STATEMENT: ***  PERTINENT HISTORY:  ***  PAIN:  Are you having pain? Yes: {yespain:27235::"NPRS scale: ***/10","Pain location: ***","Pain description: ***","Aggravating factors: ***","Relieving factors: ***"}   PRECAUTIONS: {Therapy precautions:24002}  WEIGHT BEARING RESTRICTIONS {Yes ***/No:24003}  FALLS:  Has patient fallen in last 6 months? {fallsyesno:27318}  LIVING ENVIRONMENT: Lives with: {[facility] lives with:25569::"lives  with their family"} Lives in: {Lives in:25570} Stairs: {opstairs:27293} Has following equipment at home: {Assistive devices:23999}  OCCUPATION: ***  PLOF: {PLOF:24004}  PATIENT GOALS ***   OBJECTIVE:   ***       HOME EXERCISE PROGRAM: ***  ASSESSMENT:  CLINICAL IMPRESSION: Patient is a *** y.o. *** who was seen today for physical therapy evaluation and treatment for ***.    OBJECTIVE IMPAIRMENTS {opptimpairments:25111}.   ACTIVITY LIMITATIONS {activitylimitations:27494}  PARTICIPATION LIMITATIONS: {participationrestrictions:25113}  PERSONAL FACTORS {Personal factors:25162} are also affecting patient's functional outcome.   REHAB POTENTIAL: {rehabpotential:25112}  CLINICAL DECISION MAKING: {clinical decision making:25114}  EVALUATION COMPLEXITY: {Evaluation complexity:25115}   PATIENT EDUCATION:   Education details: *** Person educated: {Person educated:25204} Education method: {Education Method:25205} Education comprehension: {Education Comprehension:25206}   PLAN: PT FREQUENCY: {rehab frequency:25116}  PT DURATION: {rehab duration:25117}  PLANNED INTERVENTIONS: {rehab planned interventions:25118::"Therapeutic exercises","Therapeutic activity","Neuromuscular re-education","Balance training","Gait training","Patient/Family education","Joint mobilization"}.  PLAN FOR NEXT SESSION: ***   GOALS: Goals reviewed with patient?  {yes/no:20286}  SHORT TERM GOALS: Target date: {follow up:25551}  *** Baseline: Goal status: {GOALSTATUS:25110}  2.  *** Baseline:  Goal status: {GOALSTATUS:25110}  3.  *** Baseline:  Goal status: {GOALSTATUS:25110}  4.  *** Baseline:  Goal status: {GOALSTATUS:25110}  5.  *** Baseline:  Goal status: {GOALSTATUS:25110}  6.  *** Baseline:  Goal status: {GOALSTATUS:25110}  LONG TERM GOALS: Target date: {follow up:25551}  *** Baseline:  Goal status: {GOALSTATUS:25110}  2.  *** Baseline:  Goal status: {GOALSTATUS:25110}  3.  *** Baseline:  Goal status: {GOALSTATUS:25110}  4.  *** Baseline:  Goal status: {GOALSTATUS:25110}  5.  *** Baseline:  Goal status: {GOALSTATUS:25110}  6.  *** Baseline:  Goal status: {GOALSTATUS:25110}   Mariane Masters, PT 09/22/2021, 12:27 PM

## 2021-09-22 NOTE — Therapy (Signed)
OUTPATIENT PHYSICAL THERAPY EVALUATION   Patient Name: Kelly Colon MRN: [id number] DOB:Jul 18, 1975, 46 y.o., female Today's Date: 09/22/2021   PT End of Session - 09/22/21 0833     Visit Number 1    Number of Visits 10    Date for PT Re-Evaluation 12/01/21    PT Start Time 0809    PT Stop Time 0905    PT Time Calculation (min) 56 min             Past Medical History:  Diagnosis Date   Crohn's disease (HCC)    DVT (deep venous thrombosis) (HCC)    Endometriosis    Gastric ulcer    Headache    Migraine   Hyperlipidemia    Hypothyroidism    Osteopenia    Osteoporosis    Past Surgical History:  Procedure Laterality Date   ABDOMINAL HYSTERECTOMY  2017   APPENDECTOMY     BREAST CYST EXCISION Right    when she was 18   COLONOSCOPY WITH PROPOFOL N/A 01/14/2015   Procedure: COLONOSCOPY WITH PROPOFOL;  Surgeon: Scot Jun, MD;  Location: [facility] ENDOSCOPY;  Service: Endoscopy;  Laterality: N/A;   LAPAROSCOPY     x 4, for treatment of endometriosis   OOPHORECTOMY Left    PELVIC LAPAROSCOPY     2005, 2007, 2010, 2013   THYROIDECTOMY     Patient Active Problem List   Diagnosis Date Noted   DVT (deep venous thrombosis) (HCC) 12/05/2018   History of DVT (deep vein thrombosis) 02/14/2018   Surgical menopause 10/19/2015   Hyperlipemia, mixed 08/06/2014   Migraine without aura 12/22/2013   Anxiety 12/22/2013   Insomnia 12/22/2013   Mollusca contagiosa 06/12/2013   Osteopenia 03/13/2013   Acquired hypothyroidism 03/13/2013   Crohn's disease (HCC) 12/07/2011   Endometriosis 10/05/2011      REFERRING PROVIDER: Marvel Plan  REFERRING DIAG: Dysuria  Rationale for Evaluation and Treatment Rehabilitation  THERAPY DIAG:  Sacrococcygeal disorders, not elsewhere classified  Abnormal posture  Other abnormalities of gait and mobility  ONSET DATE:   SUBJECTIVE:                                                                                                                                                                                            SUBJECTIVE STATEMENT: 1) Urinary urgency: Pt feels anxiety and vaginal dryness drives her urinary urgency. Pt goes 3-4 x within 2 hours. Daily fluid intake: 32 fl oz water, 8 fl oz tea, coffee only 2-3 x week. Pt has IC and she knows coffee and tea aggravates IC. Her IC flare ups occurs every 2 months. Pt  feels she is able to fully empty urine and bowels and does not have to strain. Denied SUI. night time voiding : 1 x night and increases with stress . Pt takes medication to go to sleep.   2) Hx of pain with intercourse and use of vaginal [place] 2) CLBP located in the center without radiating pain. Lifting vaccum and bending causes back pain. 3/10 level of pain . Pt has had this back pain for the past 5 -7 years. 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 Pt started working out again 3 years ago in the gym 3 x week and noticed this back pain during the exercises. Pt no longer runs. Pt walks 3 miles every other day. Attends cycling 1-2 x week for one hour. Boot camp classes 3x week with sit up and crunches, weight bearing exercises, ddumbbels 8-12lb for UE, 12-15 lbs for legs. Modes of relaxation: listening to live music, shag dancing, taking a walk   PERTINENT HISTORY:  endometriosis, ovarian cysts and uterine fibroids . hysterectomy 2017-2018, anxiety, 6-7 hours of sleep and takes meds to fall asleep, Hx of migraines. Works at [employer] and sit for al long period  PAIN:  Are you having pain? Yes with simulated task with lifting vaccuum   PRECAUTIONS: None  WEIGHT BEARING RESTRICTIONS No  FALLS:  Has patient fallen in last 6 months? No  LIVING ENVIRONMENT: Lives with: lives alone Lives in: House/apartment Stairs: Yes: External: X steps;    OCCUPATION: Sales executive  PLOF: Independent  PATIENT GOALS  address urinary urgency and vaginal dryness and help with pain with  intercourse   OBJECTIVE:   Ankles crossed in seated position  R iliac rest lower,  R hip hike in gait  L ( lower ASIS/ malleoli)   1 fingers width supra pubic, flared anterior ribs, 2 fingers below sternum  pertubration of pelvic   Minor scar restrictions over abdominal area  Bending/simulated task lifting vacuum  with minimal glut activation  compensation with knee flexion with R sideflexion  hip abd 3-/5 B       HOME EXERCISE PROGRAM: See pt instruction section  ASSESSMENT:  CLINICAL IMPRESSION: Patient is a 46 y.o. female who was seen today for physical therapy evaluation and treatment for urinary urgency and CLBP without radiating pain.               Pt's musculoskeletal assessment revealed uneven pelvic girdle/ shoulder height, diastasis recti, dyscoordination and strength of pelvic floor mm, hip weakness, and poor body               mechanics which places strain on the abdominal/pelvic floor mm.   These are deficits that indicate an ineffective intraabdominal pressure system associated with increased risk for pt's Sx.     Pt performs many gravity-loaded tasks at work and home. Pt will benefit from proper coordination training and education on fitness and functional positions in order to yield greater outcomes as pt performed sit-ups/ crunches in the past. Advised pt to not perform sit-ups and crunches as these movement patterns lead to more downward forces on the pelvic floor, negatively impacting abdominopelvic/spinal dysfunctions.    Pt was provided education on etiology of Sx with anatomy, physiology explanation with images along with the benefits of customized pelvic PT Tx based on pt's medical conditions and   musculoskeletal deficits.  Explained the physiology of deep core mm coordination and roles of pelvic floor function in urination, defecation, sexual  function, and postural control with deep core mm system.   Regional interdependent approaches will yield  greater benefits in pt's POC due to the complexity of pt's medical Hx and the significant impact their Sx have had on their QOL.  Following Tx today which pt tolerated without complaints, pt demo'd proper mechanics with sitting posture. Pt was provided handout on ergonomic set up at work to minimize poor sitting posture and was explained the implications for poor posture for her Sx. Pt demo'd correct form with lifting vacuum to minimize CLBP. Pt benefit from skilled PT.      OBJECTIVE IMPAIRMENTS Abnormal gait, decreased activity tolerance, decreased coordination, decreased endurance, decreased mobility, difficulty walking, decreased ROM, decreased strength, decreased safety awareness, hypomobility, increased fascial restrictions, increased muscle spasms, impaired flexibility, improper body mechanics, and pain.   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   REHAB POTENTIAL: Good  CLINICAL DECISION MAKING: Evolving/moderate complexity  EVALUATION COMPLEXITY: Moderate   PATIENT EDUCATION:   Education details: see pt instruction section Person educated: Patient Education method: Explanation, Demonstration, Tactile cues, Verbal cues, and Handouts Education comprehension: verbalized understanding, returned demonstration, verbal cues required, and tactile cues required   PLAN: PT FREQUENCY: 1x/week  PT DURATION: 10 weeks  PLANNED INTERVENTIONS: Therapeutic exercises, Therapeutic activity, Neuromuscular re-education, Balance training, Gait training, Patient/Family education, Joint mobilization, Dry Needling, Spinal  mobilization, Cryotherapy, Moist heat, scar mobilization, Taping, and Manual therapy.  PLAN FOR NEXT SESSION: Address uneven pelvic / shoulder height    GOALS: Goals reviewed with patient? Yes  SHORT TERM GOALS: Target date: 10/20/2021     Pt will be IND with HEP  Baseline:not IND  Goal status: INITIAL   LONG TERM GOALS: Target date: 12/01/2021  will demo proper deep core coordination without chest breathing and optimal excursion of diaphragm/pelvic floor in order to promote spinal stability and pelvic floor function Baseline:  dyscoordination Goal status: INITIAL  2.  Pt will demo proper alignment and technique with sitting, logrolling, sit to stand, lifting, body mechanics with ADLs, and fitness routine to minimize straining of abdominopelvic floor mm and spine.  Baseline:  poor technique Goal status: INITIAL  3. Pt will be IND with relaxation practices in order to report no IC flare ups across 2 months   Baseline:  2 flare ups per 2 months  Goal status: INITIAL  4.  Pt will demo > 5 pt change on FOTO  to improve QOL and function Urinary Problem baseline Pelvic Pain baseline Bowel baseline   Baseline:  Goal status: INITIAL  5.  Pt will report decreased frequency to 1 x per 2 hours in order to participate in community activities  Baseline:  3-4 x per 2 hour period  Goal status: INITIAL  6.  Pt will demo IND with alignment and fitness techniques to continue working out with less risk for overactive of pelvic floor mm associated with pelvic pain and urgency  Baseline:  not IND  Goal status: INITIAL   Mariane Masters, PT 09/22/2021, 12:33 PM

## 2021-09-28 ENCOUNTER — Encounter: Payer: BC Managed Care – PPO | Admitting: Physical Therapy

## 2021-09-29 ENCOUNTER — Ambulatory Visit: Payer: BC Managed Care – PPO | Admitting: Physical Therapy

## 2021-10-03 ENCOUNTER — Encounter: Payer: BC Managed Care – PPO | Admitting: Physical Therapy

## 2021-10-06 ENCOUNTER — Encounter: Payer: BC Managed Care – PPO | Admitting: Physical Therapy

## 2021-10-09 ENCOUNTER — Encounter: Payer: BC Managed Care – PPO | Admitting: Physical Therapy

## 2021-10-13 ENCOUNTER — Encounter: Payer: BC Managed Care – PPO | Admitting: Physical Therapy

## 2021-10-17 ENCOUNTER — Encounter: Payer: BC Managed Care – PPO | Admitting: Physical Therapy

## 2021-10-20 ENCOUNTER — Encounter: Payer: BC Managed Care – PPO | Admitting: Physical Therapy

## 2021-10-27 ENCOUNTER — Encounter: Payer: BC Managed Care – PPO | Admitting: Physical Therapy

## 2021-11-03 ENCOUNTER — Ambulatory Visit: Payer: BC Managed Care – PPO | Attending: Urology | Admitting: Physical Therapy

## 2021-11-03 ENCOUNTER — Telehealth: Payer: Self-pay | Admitting: Physical Therapy

## 2021-11-03 NOTE — Telephone Encounter (Signed)
Physical therapist called pt about missed appt today. Pt stated she had called before the appt indicating that she will not be able to pursue more appts because she would have to pay in full for the appts. Therapist listened to pt's concerns and provided other options/ resources for pelvic PT.   Information  was emailed to pt.

## 2021-11-10 ENCOUNTER — Encounter: Payer: BC Managed Care – PPO | Admitting: Physical Therapy

## 2021-11-17 ENCOUNTER — Encounter: Payer: BC Managed Care – PPO | Admitting: Physical Therapy

## 2021-12-01 ENCOUNTER — Encounter: Payer: BC Managed Care – PPO | Admitting: Physical Therapy

## 2021-12-05 ENCOUNTER — Other Ambulatory Visit: Payer: Self-pay | Admitting: Urology

## 2021-12-05 DIAGNOSIS — R300 Dysuria: Secondary | ICD-10-CM

## 2021-12-08 ENCOUNTER — Telehealth: Payer: Self-pay | Admitting: Urology

## 2021-12-08 NOTE — Telephone Encounter (Signed)
Notified pt to use GoodRx coupon.

## 2021-12-08 NOTE — Telephone Encounter (Signed)
Patient called and stated that [facility] told her that her prescription for Uro-MP 118 mg caps is not covered by insurance, and it is too expensive to get out of pocket (75.00 for one week supply). Is there another medication that may be more cost effective that can be prescribed instead? Pharmacy is [facility] in [place] on [address].

## 2021-12-11 NOTE — Telephone Encounter (Signed)
error 

## 2022-06-20 ENCOUNTER — Other Ambulatory Visit: Payer: Self-pay | Admitting: Internal Medicine

## 2022-06-20 DIAGNOSIS — N63 Unspecified lump in unspecified breast: Secondary | ICD-10-CM

## 2022-07-06 ENCOUNTER — Other Ambulatory Visit: Payer: BC Managed Care – PPO

## 2022-07-13 ENCOUNTER — Ambulatory Visit
Admission: RE | Admit: 2022-07-13 | Discharge: 2022-07-13 | Disposition: A | Discharge status: Home / Self Care | Payer: BC Managed Care – PPO | Source: Ambulatory Visit | Attending: Internal Medicine | Admitting: Internal Medicine

## 2022-07-13 DIAGNOSIS — N63 Unspecified lump in unspecified breast: Secondary | ICD-10-CM | POA: Diagnosis present

## 2023-02-01 ENCOUNTER — Ambulatory Visit: Payer: BC Managed Care – PPO

## 2023-02-01 DIAGNOSIS — Z1211 Encounter for screening for malignant neoplasm of colon: Secondary | ICD-10-CM | POA: Diagnosis present

## 2023-06-26 ENCOUNTER — Other Ambulatory Visit: Payer: Self-pay | Admitting: Internal Medicine

## 2023-06-26 DIAGNOSIS — Z1231 Encounter for screening mammogram for malignant neoplasm of breast: Secondary | ICD-10-CM

## 2023-07-17 ENCOUNTER — Ambulatory Visit
Admission: RE | Admit: 2023-07-17 | Discharge: 2023-07-17 | Disposition: A | Discharge status: Home / Self Care | Source: Ambulatory Visit | Attending: Internal Medicine | Admitting: Internal Medicine

## 2023-07-17 DIAGNOSIS — Z1231 Encounter for screening mammogram for malignant neoplasm of breast: Secondary | ICD-10-CM | POA: Diagnosis present

## 2023-12-16 ENCOUNTER — Other Ambulatory Visit: Payer: Self-pay | Admitting: Internal Medicine

## 2023-12-16 DIAGNOSIS — E782 Mixed hyperlipidemia: Secondary | ICD-10-CM

## 2023-12-16 DIAGNOSIS — Z0000 Encounter for general adult medical examination without abnormal findings: Secondary | ICD-10-CM

## 2023-12-31 ENCOUNTER — Ambulatory Visit: Admitting: Physician Assistant

## 2024-01-17 ENCOUNTER — Other Ambulatory Visit

## 2024-01-24 ENCOUNTER — Other Ambulatory Visit

## 2024-07-01 ENCOUNTER — Ambulatory Visit: Admitting: Physician Assistant
